# Patient Record
Sex: Male | Born: 1980 | Race: White | Hispanic: No | Marital: Single | Smoking: Never smoker
Health system: Southern US, Community
[De-identification: ages and names within clinical notes are randomized; demographics above are authoritative.]

## PROBLEM LIST (undated history)

## (undated) DIAGNOSIS — J45909 Unspecified asthma, uncomplicated: Secondary | ICD-10-CM

## (undated) HISTORY — PX: MYRINGOTOMY: SUR874

## (undated) HISTORY — PX: ADENOIDECTOMY: SUR15

## (undated) HISTORY — DX: Unspecified asthma, uncomplicated: J45.909

---

## 2012-09-10 ENCOUNTER — Ambulatory Visit (INDEPENDENT_AMBULATORY_CARE_PROVIDER_SITE_OTHER): Payer: Federal, State, Local not specified - PPO | Admitting: Sports Medicine

## 2012-09-10 ENCOUNTER — Emergency Department (INDEPENDENT_AMBULATORY_CARE_PROVIDER_SITE_OTHER)
Admission: EM | Admit: 2012-09-10 | Discharge: 2012-09-10 | Disposition: A | Discharge status: Home / Self Care | Payer: Federal, State, Local not specified - PPO | Source: Home / Self Care | Attending: Family Medicine | Admitting: Family Medicine

## 2012-09-10 ENCOUNTER — Encounter: Payer: Self-pay | Admitting: *Deleted

## 2012-09-10 ENCOUNTER — Emergency Department (INDEPENDENT_AMBULATORY_CARE_PROVIDER_SITE_OTHER): Payer: Federal, State, Local not specified - PPO

## 2012-09-10 DIAGNOSIS — M65849 Other synovitis and tenosynovitis, unspecified hand: Secondary | ICD-10-CM

## 2012-09-10 DIAGNOSIS — IMO0002 Reserved for concepts with insufficient information to code with codable children: Secondary | ICD-10-CM

## 2012-09-10 DIAGNOSIS — M65839 Other synovitis and tenosynovitis, unspecified forearm: Secondary | ICD-10-CM

## 2012-09-10 DIAGNOSIS — S6990XA Unspecified injury of unspecified wrist, hand and finger(s), initial encounter: Secondary | ICD-10-CM

## 2012-09-10 DIAGNOSIS — R209 Unspecified disturbances of skin sensation: Secondary | ICD-10-CM

## 2012-09-10 DIAGNOSIS — S6991XA Unspecified injury of right wrist, hand and finger(s), initial encounter: Secondary | ICD-10-CM

## 2012-09-10 DIAGNOSIS — M79609 Pain in unspecified limb: Secondary | ICD-10-CM

## 2012-09-10 DIAGNOSIS — M778 Other enthesopathies, not elsewhere classified: Secondary | ICD-10-CM

## 2012-09-10 NOTE — Assessment & Plan Note (Signed)
The patient would like to minimize the amount of prescription medication or procedures that are performed. I recommended icing, as well as ibuprofen 800 mg 3 times a day. He can come back to see me if no better in one to 2 weeks.

## 2012-09-10 NOTE — Progress Notes (Signed)
   Subjective:    I'm seeing this patient as a consultation for:  Dr. Cathren Harsh  CC: Right hand pain  HPI: This is a very pleasant 32 year old male who comes in after actually banging the dorsum of his right hand on a wrench. He had immediate pain, swelling, and inability to make a complete fist. It is localized, doesn't radiate, moderate. His main reason for coming in today was to ensure that there was a fracture.  Past medical history, Surgical history, Family history not pertinant except as noted below, Social history, Allergies, and medications have been entered into the medical record, reviewed, and no changes needed.   Review of Systems: No headache, visual changes, nausea, vomiting, diarrhea, constipation, dizziness, abdominal pain, skin rash, fevers, chills, night sweats, weight loss, swollen lymph nodes, body aches, joint swelling, muscle aches, chest pain, shortness of breath, mood changes, visual or auditory hallucinations.   Objective:   General: Well Developed, well nourished, and in no acute distress.  Neuro/Psych: Alert and oriented x3, extra-ocular muscles intact, able to move all 4 extremities, sensation grossly intact. Skin: Warm and dry, no rashes noted.  Respiratory: Not using accessory muscles, speaking in full sentences, trachea midline.  Cardiovascular: Pulses palpable, no extremity edema. Abdomen: Does not appear distended. Right Hand: swollen over the dorsum, he has tenderness to palpation over the extensor digitorum communis of the third digit. He has pain with resisted extension of the third digit, can make a full fist. He is neurovascularly intact distally.  X-rays show no sign of fracture, dislocation, or degenerative change.   Impression and Recommendations:   This case required medical decision making of moderate complexity.

## 2012-09-10 NOTE — ED Provider Notes (Signed)
History     CSN: [id number]  Arrival date & time 09/10/12  1537   First MD Initiated Contact with Patient 09/10/12 1605      Chief Complaint  Patient presents with  . Finger Injury      HPI Comments: While loading hay two months ago, patient accidentally bumped the dorsum of his right 3rd MCP joint and has had persistent intermittent pain there with occasional movements.  Yesterday a wrench slipped and he forcefully bumped the dorsum of his right hand and has had persistent swelling and tenderness there also.  Patient is a 32 y.o. male presenting with hand pain. The history is provided by the patient.  Hand Pain This is a new problem. Episode onset: 2 months ago. The problem occurs constantly. The problem has not changed since onset.Associated symptoms comments: none. Exacerbated by: grasping. Nothing relieves the symptoms. He has tried nothing for the symptoms.    History reviewed. No pertinent past medical history.  Past Surgical History  Procedure Laterality Date  . Myringotomy    . Adenoidectomy      Family History  Problem Relation Age of Onset  . Hyperlipidemia Father   . Hypertension Father     History  Substance Use Topics  . Smoking status: Never Smoker   . Smokeless tobacco: Not on file  . Alcohol Use: No      Review of Systems  All other systems reviewed and are negative.    Allergies  Review of patient's allergies indicates no known allergies.  Home Medications  No current outpatient prescriptions on file.  BP 147/80  Pulse 66  Temp(Src) 98.5 F (36.9 C) (Oral)  Resp 18  Wt 209 lb (94.802 kg)  SpO2 99%  Physical Exam  Nursing note and vitals reviewed. Constitutional: He is oriented to person, place, and time. He appears well-developed and well-nourished. No distress.  HENT:  Head: Normocephalic.  Eyes: Conjunctivae are normal. Pupils are equal, round, and reactive to light.  Musculoskeletal: Normal range of motion. He exhibits tenderness.        Right hand: He exhibits tenderness, bony tenderness and swelling. He exhibits normal two-point discrimination, normal capillary refill, no deformity and no laceration. Normal sensation noted. Normal strength noted.       Hands: Right hand reveals tenderness and mild swelling dorsally over third metacarpal and extensor indicis tendon.  Patient has difficulty with full flexion of the 3rd MCP joint.  Distal neurovascular function is intact.   Neurological: He is alert and oriented to person, place, and time.  Skin: Skin is warm and dry. No rash noted.    ED Course  Procedures  none   Dg Hand Complete Right  09/10/2012  *RADIOLOGY REPORT*  Clinical Data: Pain and tenderness over the third metacarpal phalangeal joint secondary to injury.  RIGHT HAND - COMPLETE 3+ VIEW  Comparison: None.  Findings: There is no fracture, dislocation, joint space narrowing, subluxation, arthritis, or other abnormality.  IMPRESSION: Normal exam.   Original Report Authenticated By: Francene Boyers, M.D.      1. Hand injury, right, initial encounter       MDM   Consultation obtained with Dr. Rodney Langton  Apply ice pack for 30 to 45 minutes every 1 to 4 hours.  Continue until swelling decreases.  Take Ibuprofen 200mg , 3 or 4 tabs every 8 hours with food.  Followup with Dr. Rodney Langton if not improving        Lattie Haw, MD 09/10/12 1714

## 2012-09-10 NOTE — ED Notes (Signed)
Pt c/o RT 3rd finger injury x after injuring it, worse x yesterday after hitting on a piece of metal while using a wrench. No OTC meds.

## 2012-12-19 ENCOUNTER — Encounter: Payer: Self-pay | Admitting: *Deleted

## 2012-12-19 ENCOUNTER — Emergency Department (INDEPENDENT_AMBULATORY_CARE_PROVIDER_SITE_OTHER)
Admission: EM | Admit: 2012-12-19 | Discharge: 2012-12-19 | Disposition: A | Discharge status: Home / Self Care | Payer: Federal, State, Local not specified - PPO | Source: Home / Self Care | Attending: Family Medicine | Admitting: Family Medicine

## 2012-12-19 DIAGNOSIS — J302 Other seasonal allergic rhinitis: Secondary | ICD-10-CM

## 2012-12-19 DIAGNOSIS — J309 Allergic rhinitis, unspecified: Secondary | ICD-10-CM

## 2012-12-19 MED ORDER — FLUTICASONE PROPIONATE 50 MCG/ACT NA SUSP: NASAL | Status: DC

## 2012-12-19 MED ORDER — PREDNISONE 20 MG PO TABS: 20.0000 mg | ORAL_TABLET | Freq: Two times a day (BID) | ORAL | Status: DC

## 2012-12-19 NOTE — ED Provider Notes (Signed)
CSN: [id number]     Arrival date & time 12/19/12  0847 History     First MD Initiated Contact with Patient 12/19/12 [phone]     Chief Complaint  Patient presents with  . URI      HPI Comments: Patient complains of 5 day history of sinus congestion, now with rhinorrhea.  He has a rare cough.  No sore throat, fatigue, myalgias, or fevers, chills, and sweats.  He feels well. He states that this condition begins every spring and fall, and clears by late fall or early winter.  He is presently taking Zyrtec with mild improvement.  The history is provided by the patient.    History reviewed. No pertinent past medical history. Past Surgical History  Procedure Laterality Date  . Myringotomy    . Adenoidectomy     Family History  Problem Relation Age of Onset  . Hyperlipidemia Father   . Hypertension Father    History  Substance Use Topics  . Smoking status: Never Smoker   . Smokeless tobacco: Current User    Types: Snuff  . Alcohol Use: No    Review of Systems No sore throat + rare cough + hoarseness No pleuritic pain + wheezing occasionally + nasal congestion + post-nasal drainage No sinus pain/pressure No itchy/red eyes No earache No hemoptysis No SOB No fever/chills No nausea No vomiting No abdominal pain No diarrhea No urinary symptoms No skin rashes No fatigue No myalgias No headache Used OTC meds without relief  Allergies  Review of patient's allergies indicates no known allergies.  Home Medications   Current Outpatient Rx  Name  Route  Sig  Dispense  Refill  . fluticasone (FLONASE) 50 MCG/ACT nasal spray      Place 2 sprays in each nostril once daily for allergy symptoms   16 g   2   . predniSONE (DELTASONE) 20 MG tablet   Oral   Take 1 tablet (20 mg total) by mouth 2 (two) times daily.   10 tablet   0    BP 133/82  Pulse 87  Temp(Src) 98.7 F (37.1 C) (Oral)  Resp 14  Ht 6' (1.829 m)  Wt 208 lb (94.348 kg)  BMI 28.2 kg/m2  SpO2  99% Physical Exam Nursing notes and Vital Signs reviewed. Appearance:  Patient appears healthy, stated age, and in no acute distress Eyes:  Pupils are equal, round, and reactive to light and accomodation.  Extraocular movement is intact.  Conjunctivae are not inflamed  Ears:  Canals normal.  Tympanic membranes normal.  Nose:  Mildly congested turbinates.  No sinus tenderness.  Pharynx:  Normal Neck:  Supple.  No adenopathy Lungs:  Clear to auscultation.  Breath sounds are equal.  Heart:  Regular rate and rhythm without murmurs, rubs, or gallops.  Abdomen:  Nontender without masses or hepatosplenomegaly.  Bowel sounds are present.  No CVA or flank tenderness.  Extremities:  No edema.  No calf tenderness Skin:  No rash present.   ED Course   Procedures  none    1. Seasonal allergic rhinitis; no evidence URI at this time     MDM  Begin fluticasone nasal spray.  Prednisone burst May use Afrin nasal spray (or generic oxymetazoline) twice daily for about 5 days.  Also recommend using saline nasal spray several times daily and saline nasal irrigation (AYR is a common brand).  Use fluticasone spray each morning after using Afrin May Continue Zyrtec, Allegra, etc..  If cold-like symptoms develop, begin  Mucinex D May take Delsym Cough Suppressant at bedtime for nighttime cough.   Lattie Haw, MD 12/19/12 [phone]

## 2012-12-19 NOTE — ED Notes (Signed)
Jaqua c/o congestion, sneexing and non-productive cough without fever x 3 days.

## 2012-12-21 ENCOUNTER — Telehealth: Payer: Self-pay | Admitting: Emergency Medicine

## 2013-07-02 ENCOUNTER — Emergency Department
Admission: EM | Admit: 2013-07-02 | Discharge: 2013-07-02 | Disposition: A | Discharge status: Home / Self Care | Payer: Federal, State, Local not specified - PPO | Source: Home / Self Care | Attending: Family Medicine | Admitting: Family Medicine

## 2013-07-02 ENCOUNTER — Emergency Department (INDEPENDENT_AMBULATORY_CARE_PROVIDER_SITE_OTHER): Payer: Federal, State, Local not specified - PPO

## 2013-07-02 ENCOUNTER — Encounter: Payer: Self-pay | Admitting: Emergency Medicine

## 2013-07-02 DIAGNOSIS — J209 Acute bronchitis, unspecified: Secondary | ICD-10-CM

## 2013-07-02 DIAGNOSIS — R05 Cough: Secondary | ICD-10-CM

## 2013-07-02 DIAGNOSIS — H6692 Otitis media, unspecified, left ear: Secondary | ICD-10-CM

## 2013-07-02 DIAGNOSIS — R059 Cough, unspecified: Secondary | ICD-10-CM

## 2013-07-02 DIAGNOSIS — H6690 Otitis media, unspecified, unspecified ear: Secondary | ICD-10-CM

## 2013-07-02 LAB — POCT CBC W AUTO DIFF (K'VILLE URGENT CARE)

## 2013-07-02 MED ORDER — GUAIFENESIN-CODEINE 100-10 MG/5ML PO SOLN: ORAL | Status: DC

## 2013-07-02 MED ORDER — CLARITHROMYCIN 500 MG PO TABS: 500.0000 mg | ORAL_TABLET | Freq: Two times a day (BID) | ORAL | Status: DC

## 2013-07-02 MED ORDER — METHYLPREDNISOLONE SODIUM SUCC 125 MG IJ SOLR
125.0000 mg | Freq: Once | INTRAMUSCULAR | Status: AC
  Administered 2013-07-02: 125 mg via INTRAMUSCULAR

## 2013-07-02 MED ORDER — PREDNISONE 20 MG PO TABS: 20.0000 mg | ORAL_TABLET | Freq: Two times a day (BID) | ORAL | Status: DC

## 2013-07-02 NOTE — Discharge Instructions (Signed)
Take plain Mucinex (1200 mg guaifenesin) twice daily for cough and congestion.  May add Sudafed for sinus congestion.   Increase fluid intake, rest. °May use Afrin nasal spray (or generic oxymetazoline) twice daily for about 5 days.  Also recommend using saline nasal spray several times daily and saline nasal irrigation (AYR is a common brand) °Stop all antihistamines for now, and other non-prescription cough/cold preparations. °Follow-up with family doctor if not improving 7 to 10 days.  °

## 2013-07-02 NOTE — ED Provider Notes (Signed)
CSN: [id number]     Arrival date & time 07/02/13  1137 History   First MD Initiated Contact with Patient 07/02/13 1218     Chief Complaint  Patient presents with  . Cough        HPI Comments: Patient complains of a persistent cough for about 3 weeks.  Two weeks ago he was treated with cough syrup, Tessalon, and prednisone for 3 days but did not improve.  His cough is now worse and non-productive, especially at night; he often coughs until he gags.  He becomes winded with exertion, and also has shortness of breath when exposed to cold air.  Over the past three days he has had pressure in his ears.  Last night he had chills/sweats. He reports that his Tdap is current. Family history of asthma in his father.  The history is provided by the patient.    History reviewed. No pertinent past medical history. Past Surgical History  Procedure Laterality Date  . Myringotomy    . Adenoidectomy     Family History  Problem Relation Age of Onset  . Hyperlipidemia Father   . Hypertension Father    History  Substance Use Topics  . Smoking status: Never Smoker   . Smokeless tobacco: Current User    Types: Snuff  . Alcohol Use: No    Review of Systems + sore throat + cough No pleuritic pain + wheezing + nasal congestion ? post-nasal drainage No sinus pain/pressure No itchy/red eyes ? earache No hemoptysis + SOB with exertion No fever, + chills No nausea No vomiting No abdominal pain No diarrhea No urinary symptoms No skin rash + fatigue No myalgias No headache Used OTC meds without relief    Allergies  Review of patient's allergies indicates not on file.  Home Medications   Current Outpatient Rx  Name  Route  Sig  Dispense  Refill  . clarithromycin (BIAXIN) 500 MG tablet   Oral   Take 1 tablet (500 mg total) by mouth 2 (two) times daily.   14 tablet   0   . fluticasone (FLONASE) 50 MCG/ACT nasal spray      Place 2 sprays in each nostril once daily for allergy  symptoms   16 g   2   . guaiFENesin-codeine 100-10 MG/5ML syrup      Take 10mL by mouth at bedtime as needed for cough   120 mL   0   . predniSONE (DELTASONE) 20 MG tablet   Oral   Take 1 tablet (20 mg total) by mouth 2 (two) times daily.   10 tablet   0    BP 140/92  Pulse 90  Temp(Src) 98.4 F (36.9 C) (Oral)  Ht 6' (1.829 m)  Wt 216 lb (97.977 kg)  BMI 29.29 kg/m2  SpO2 99% Physical Exam Nursing notes and Vital Signs reviewed. Appearance:  Patient appears healthy, stated age, and in no acute distress Eyes:  Pupils are equal, round, and reactive to light and accomodation.  Extraocular movement is intact.  Conjunctivae are not inflamed  Ears:  Canals normal.   Right tympanic membrane is normal; left tympanic membrane is erythematous  Nose:  Congested turbinates, worse on left.  No sinus tenderness.    Pharynx:  Normal Neck:  Supple.  Non-tender but prominent posterior nodes are palpated bilaterally  Lungs:  Clear to auscultation.  Breath sounds are equal.  Heart:  Regular rate and rhythm without murmurs, rubs, or gallops.  Abdomen:  Nontender without  masses or hepatosplenomegaly.  Bowel sounds are present.  No CVA or flank tenderness.  Extremities:  No edema.  No calf tenderness Skin:  No rash present.   ED Course  Procedures  none    Labs Reviewed  POCT CBC W AUTO DIFF ([facility]):  WBC 5.5; LY 29.4; MO 9.3; GR 61.3; Hgb 14.6; Platelets 250    Imaging Review Dg Chest 2 View  07/02/2013   CLINICAL DATA:  Dry cough.  EXAM: CHEST  2 VIEW  COMPARISON:  None.  FINDINGS: The cardiac silhouette, mediastinal and hilar contours are within normal limits. The lungs demonstrate mild bronchitic changes suggesting bronchitis. No focal infiltrates, edema or effusions. The bony thorax is intact. Mild right convex thoracic scoliosis.  IMPRESSION: Bronchitic changes suggesting bronchitis.  No focal infiltrates.   Electronically Signed   By: Loralie ChampagneMark  Gallerani M.D.   On:  07/02/2013 12:49      MDM   Final diagnoses:  Acute bronchitis:  Note normal white count; ?atypical agent.  Suspect onset of bronchospasm (note family history of asthma in father)  Left otitis media    Solumedrol 125mg  IM.  Begin prednisone taper tomorrow. Robitussin AC at bedtme.  Begin Biaxin Take plain Mucinex (1200 mg guaifenesin) twice daily for cough and congestion.  May add Sudafed for sinus congestion.   Increase fluid intake, rest. May use Afrin nasal spray (or generic oxymetazoline) twice daily for about 5 days.  Also recommend using saline nasal spray several times daily and saline nasal irrigation (AYR is a common brand) Stop all antihistamines for now, and other non-prescription cough/cold preparations. Follow-up with family doctor if not improving 7 to 10 days.  Follow-up with ENT if left ear not improved one week.    Lattie HawStephen A Beese, MD 07/04/13 [phone]

## 2013-07-02 NOTE — ED Notes (Signed)
Cough x 3 weeks. Treated on 2/12 with cough syrup, tessalon, 3 days of prednisone, not better

## 2013-07-06 ENCOUNTER — Telehealth: Payer: Self-pay

## 2013-07-06 NOTE — ED Notes (Signed)
I called and spoke with patient and he is doing better. I advised to call back if anything changes or if he has questions or concerns.  

## 2014-07-13 ENCOUNTER — Encounter: Payer: Self-pay | Admitting: Emergency Medicine

## 2014-07-13 ENCOUNTER — Emergency Department
Admission: EM | Admit: 2014-07-13 | Discharge: 2014-07-13 | Disposition: A | Discharge status: Home / Self Care | Payer: Federal, State, Local not specified - PPO | Source: Home / Self Care | Attending: Family Medicine | Admitting: Family Medicine

## 2014-07-13 DIAGNOSIS — B9789 Other viral agents as the cause of diseases classified elsewhere: Principal | ICD-10-CM

## 2014-07-13 DIAGNOSIS — J069 Acute upper respiratory infection, unspecified: Secondary | ICD-10-CM

## 2014-07-13 DIAGNOSIS — J9801 Acute bronchospasm: Secondary | ICD-10-CM

## 2014-07-13 MED ORDER — BENZONATATE 200 MG PO CAPS: 200.0000 mg | ORAL_CAPSULE | Freq: Every day | ORAL | Status: DC

## 2014-07-13 MED ORDER — AMOXICILLIN 875 MG PO TABS: 875.0000 mg | ORAL_TABLET | Freq: Two times a day (BID) | ORAL | Status: DC

## 2014-07-13 MED ORDER — PREDNISONE 20 MG PO TABS: 20.0000 mg | ORAL_TABLET | Freq: Two times a day (BID) | ORAL | Status: DC

## 2014-07-13 NOTE — Discharge Instructions (Signed)
Take plain guaifenesin (1200mg  extended release tabs such as Mucinex) twice daily, with plenty of water, for cough and congestion.  May add Pseudoephedrine for sinus congestion.  Get adequate rest.   May use Afrin nasal spray (or generic oxymetazoline) twice daily for about 5 days.  Also recommend using saline nasal spray several times daily and saline nasal irrigation (AYR is a common brand).    Stop all antihistamines for now, and other non-prescription cough/cold preparations.   Follow-up with family doctor if not improving about10 days.

## 2014-07-13 NOTE — ED Notes (Signed)
Patient states he began having nasal congestion and cough 3 days ago; no documented fever. He has not taken any OTCs today.

## 2014-07-13 NOTE — ED Provider Notes (Signed)
CSN: [id number]     Arrival date & time 07/13/14  1027 History   First MD Initiated Contact with Patient 07/13/14 1048     Chief Complaint  Patient presents with  . Nasal Congestion  . Cough      HPI Comments: Three days ago patient developed fatigue and sinus congestion, followed by cough, wheezing, and shortness of breath with activity.  No sore throat.  He started taking left over amoxicillin and symptoms improved somewhat.  The history is provided by the patient.    History reviewed. No pertinent past medical history. Past Surgical History  Procedure Laterality Date  . Myringotomy    . Adenoidectomy     Family History  Problem Relation Age of Onset  . Hyperlipidemia Father   . Hypertension Father    History  Substance Use Topics  . Smoking status: Never Smoker   . Smokeless tobacco: Current User    Types: Snuff  . Alcohol Use: No    Review of Systems No sore throat + cough No pleuritic pain + wheezing + nasal congestion + post-nasal drainage + sinus pain/pressure No itchy/red eyes No earache No hemoptysis + SOB with activity No fever/chills No nausea No vomiting No abdominal pain No diarrhea No urinary symptoms No skin rash + fatigue No myalgias No headache Used OTC meds without relief  Allergies  Review of patient's allergies indicates no known allergies.  Home Medications   Prior to Admission medications   Medication Sig Start Date End Date Taking? Authorizing Provider  amoxicillin (AMOXIL) 875 MG tablet Take 1 tablet (875 mg total) by mouth 2 (two) times daily. 07/13/14   Lattie Haw, MD  benzonatate (TESSALON) 200 MG capsule Take 1 capsule (200 mg total) by mouth at bedtime. Take as needed for cough 07/13/14   Lattie Haw, MD  fluticasone [facility]) 50 MCG/ACT nasal spray Place 2 sprays in each nostril once daily for allergy symptoms 12/19/12   Lattie Haw, MD  predniSONE (DELTASONE) 20 MG tablet Take 1 tablet (20 mg total) by mouth 2  (two) times daily. 07/13/14   Lattie Haw, MD   BP 120/80 mmHg  Pulse 80  Temp(Src) 98.4 F (36.9 C) (Oral)  Resp 16  Ht 6' (1.829 m)  Wt 195 lb (88.451 kg)  BMI 26.44 kg/m2  SpO2 100% Physical Exam Nursing notes and Vital Signs reviewed. Appearance:  Patient appears stated age, and in no acute distress Eyes:  Pupils are equal, round, and reactive to light and accomodation.  Extraocular movement is intact.  Conjunctivae are not inflamed  Ears:  Canals normal.  Tympanic membranes normal.  Nose:  Mildly congested turbinates.  No sinus tenderness.  Pharynx:  Normal Neck:  Supple.  Tender enlarged posterior nodes are palpated bilaterally  Lungs:  Clear to auscultation.  Breath sounds are equal.  Heart:  Regular rate and rhythm without murmurs, rubs, or gallops.  Abdomen:  Nontender without masses or hepatosplenomegaly.  Bowel sounds are present.  No CVA or flank tenderness.  Extremities:  No edema.  No calf tenderness Skin:  No rash present.   ED Course  Procedures  none     MDM   1. Viral URI with cough   2. Bronchospasm    Begin prednisone burst.  Prescription written for Benzonatate (Tessalon) to take at bedtime for night-time cough.  Amoxicillin  BID Take plain guaifenesin (  extended release tabs such as Mucinex) twice daily, with plenty of water, for cough and congestion.  May add Pseudoephedrine for sinus congestion.  Get adequate rest.   May use Afrin nasal spray (or generic oxymetazoline) twice daily for about 5 days.  Also recommend using saline nasal spray several times daily and saline nasal irrigation (AYR is a common brand).    Stop all antihistamines for now, and other non-prescription cough/cold preparations.   Follow-up with family doctor if not improving about10 days.     Lattie HawStephen A Tomiko Schoon, MD 07/14/14 [phone]

## 2014-08-06 ENCOUNTER — Encounter: Payer: Self-pay | Admitting: Emergency Medicine

## 2014-08-06 ENCOUNTER — Emergency Department
Admission: EM | Admit: 2014-08-06 | Discharge: 2014-08-06 | Disposition: A | Discharge status: Home / Self Care | Payer: Federal, State, Local not specified - PPO | Source: Home / Self Care | Attending: Emergency Medicine | Admitting: Emergency Medicine

## 2014-08-06 DIAGNOSIS — J4520 Mild intermittent asthma, uncomplicated: Secondary | ICD-10-CM

## 2014-08-06 DIAGNOSIS — J301 Allergic rhinitis due to pollen: Secondary | ICD-10-CM | POA: Diagnosis not present

## 2014-08-06 MED ORDER — PREDNISONE 50 MG PO TABS: 50.0000 mg | ORAL_TABLET | Freq: Every day | ORAL | Status: DC

## 2014-08-06 MED ORDER — CETIRIZINE HCL 10 MG PO TABS: 10.0000 mg | ORAL_TABLET | Freq: Every day | ORAL | Status: DC

## 2014-08-06 MED ORDER — FLUTICASONE PROPIONATE 50 MCG/ACT NA SUSP: NASAL | Status: DC

## 2014-08-06 NOTE — ED Provider Notes (Addendum)
CSN: [id number]     Arrival date & time 08/06/14  1452 History   First MD Initiated Contact with Patient 08/06/14 1500     Chief Complaint  Patient presents with  . Allergies   (Consider location/radiation/quality/duration/timing/severity/associated sxs/prior Treatment) HPI Runny nose, sneezing, itchy nose, watery eyes x 2 days. Was moderate intensity, now severe. Today, progressed to slight wheezing. Occasional nonproductive cough. Denies shortness of breath. No exertional chest pain. Denies sore throat but throat feels scratchy from postnasal drainage. Has clear rhinorrhea from nose. No discolored matting or drainage from eyes. He feels this is from seasonal allergies. He tried some OTC decongestant which didn't help that much. Was seen at urgent care 3-4 weeks ago for URI with allergies and treated with prednisone and an antibiotic and that helped and he felt well after 5 days, and was asymptomatic until 2 days ago. Note that pollen counts are extremely high the past couple of days. He works outside. He denies exposure to any other toxic substance or fumes or materials. He never smoked cigarettes, but uses snuff and I advised him not to use snuff because of risks. Denies history of asthma, but there is a positive family history of asthma and one of his parents.  Remainder of Review of Systems negative for acute change except as noted in the HPI.   History reviewed. No pertinent past medical history. Past Surgical History  Procedure Laterality Date  . Myringotomy    . Adenoidectomy     Family History  Problem Relation Age of Onset  . Hyperlipidemia Father   . Hypertension Father    History  Substance Use Topics  . Smoking status: Never Smoker   . Smokeless tobacco: Current User    Types: Snuff  . Alcohol Use: No    Review of Systems  Allergies  Review of patient's allergies indicates not on file.  Home Medications   Prior to Admission medications   Medication Sig  Start Date End Date Taking? Authorizing Provider  cetirizine (ZYRTEC) 10 MG tablet Take 1 tablet (10 mg total) by mouth daily. For allergies 08/06/14   Lajean Manesavid Massey, MD  fluticasone [facility](FLONASE) 50 MCG/ACT nasal spray 1 or 2 sprays each nostril twice a day 08/06/14   Lajean Manesavid Massey, MD  predniSONE (DELTASONE) 50 MG tablet Take 1 tablet (50 mg total) by mouth daily. With food for 7 days. 08/06/14   Lajean Manesavid Massey, MD   BP 133/85 mmHg  Pulse 86  Temp(Src) 98.2 F (36.8 C) (Oral)  Ht 6' (1.829 m)  Wt 209 lb (94.802 kg)  BMI 28.34 kg/m2  SpO2 100% Physical Exam  Constitutional: He is oriented to person, place, and time. He appears well-developed and well-nourished. No distress.  HENT:  Head: Normocephalic and atraumatic.  Right Ear: Tympanic membrane normal.  Left Ear: Tympanic membrane normal.  Mouth/Throat: No oropharyngeal exudate.  Nose: Pale boggy turbinates, serous drainage. Pharynx negative except for mild lymphoid hyperplasia and serous postnasal drainage. TMs normal  Eyes: Right eye exhibits no discharge. Left eye exhibits no discharge. No scleral icterus.  Neck: Neck supple. No JVD present. No tracheal deviation present.  Cardiovascular: Normal rate, regular rhythm and normal heart sounds.   Pulmonary/Chest: Effort normal. No respiratory distress. He has no decreased breath sounds. He has no wheezes. He has no rhonchi. He has no rales.  Lungs are clear to auscultation, except only with forced expiration, there is scant late expiratory wheeze, but good air movement bilaterally.  Lymphadenopathy:    He  has no cervical adenopathy.  Neurological: He is alert and oriented to person, place, and time.  Skin: Skin is warm and dry.  Nursing note and vitals reviewed.   ED Course  Procedures (including critical care time) Labs Review Labs Reviewed - No data to display  Imaging Review No results found.   MDM   1. Allergic rhinitis due to pollen   2. Allergic bronchitis, mild  intermittent, uncomplicated    no evidence of infection based on history and physical Treatment options discussed, as well as risks, benefits, alternatives. He declined steroid shot Patient voiced understanding and agreement with the following plans: Discharge Medication List as of 08/06/2014  3:35 PM     cetirizine (ZYRTEC) 10 MG tablet Take 1 tablet (10 mg total) by mouth daily. For allergies 15 tablet     fluticasone (FLONASE) 50 MCG/ACT nasal spray 1 or 2 sprays each nostril twice a day 16 g    predniSONE (DELTASONE) 50 MG tablet Take 1 tablet (50 mg total) by mouth daily. With food for 7 days.     Also advised him to establish with PCP and follow up with PCP within one week if not improving or sooner if worse. I also suggested he see an allergist, but he declined that option at this time. He has numerous other questions are answered as best I could. He declined bronchodilator Precautions discussed. Red flags discussed. Questions invited and answered. Patient voiced understanding and agreement.  Also, at his request, note was given to excuse from work today which is Friday and tomorrow 08/07/2014   Lajean Manes, MD 08/06/14 1549  Lajean Manes, MD 08/06/14 [phone]

## 2014-08-06 NOTE — ED Notes (Signed)
Runny nose, sneezing, itchy nose, watery eyes 2 days

## 2014-12-14 ENCOUNTER — Encounter: Payer: Self-pay | Admitting: Emergency Medicine

## 2014-12-14 ENCOUNTER — Emergency Department
Admission: EM | Admit: 2014-12-14 | Discharge: 2014-12-14 | Disposition: A | Discharge status: Home / Self Care | Payer: Federal, State, Local not specified - PPO | Source: Home / Self Care | Attending: Family Medicine | Admitting: Family Medicine

## 2014-12-14 DIAGNOSIS — R5383 Other fatigue: Secondary | ICD-10-CM | POA: Diagnosis not present

## 2014-12-14 DIAGNOSIS — R52 Pain, unspecified: Secondary | ICD-10-CM

## 2014-12-14 DIAGNOSIS — W57XXXA Bitten or stung by nonvenomous insect and other nonvenomous arthropods, initial encounter: Secondary | ICD-10-CM

## 2014-12-14 LAB — POCT CBC W AUTO DIFF (K'VILLE URGENT CARE)

## 2014-12-14 MED ORDER — DOXYCYCLINE HYCLATE 100 MG PO CAPS: 100.0000 mg | ORAL_CAPSULE | Freq: Two times a day (BID) | ORAL | Status: DC

## 2014-12-14 NOTE — ED Notes (Signed)
Sat night headache, Sun morning woke up with unsettled stomach, extreme fatigue, chills fever. 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

## 2014-12-14 NOTE — ED Provider Notes (Signed)
CSN: [id number]     Arrival date & time 12/14/14  1233 History   First MD Initiated Contact with Patient 12/14/14 1249     Chief Complaint  Patient presents with  . Fever   (Consider location/radiation/quality/duration/timing/severity/associated sxs/prior Treatment) HPI The patient is a 34 year old male presenting to urgent care with complaints of generalized fatigue and body aches that started 2 days ago.  Patient states symptoms started with a generalized headache Saturday night and worsened Sunday morning with diffuse weakness, fatigue, and nausea.   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  Patient does note he has pulled several ticks off this season.  Although he does not recall ticks being there for more than 3 days, patient is not 100% certain.  Denies known rashes.  Last tick was removed about 1 week ago.  He also reports subjective fever with hot and cold chills.  States he did take some tylenol and ibuprofen yesterday that provided moderate relief, however, pt still concerned for Lyme's disease or RMSF.  Denies neck pain or stiffness.  Denies chest pain or difficulty breathing.  No sick contacts.  No recent travel.  History reviewed. No pertinent past medical history. Past Surgical History  Procedure Laterality Date  . Myringotomy    . Adenoidectomy     Family History  Problem Relation Age of Onset  . Hyperlipidemia Father   . Hypertension Father    History  Substance Use Topics  . Smoking status: Never Smoker   . Smokeless tobacco: Current User    Types: Snuff  . Alcohol Use: No    Review of Systems  Constitutional: Positive for chills, appetite change and fatigue. Negative for fever.  HENT: Negative for congestion, ear pain, sore throat, trouble swallowing and voice change.   Respiratory: Negative for cough and shortness of breath.   Cardiovascular: Negative for chest pain and  palpitations.  Gastrointestinal: Positive for nausea. Negative for vomiting, abdominal pain and diarrhea.  Musculoskeletal: Positive for myalgias and back pain. Negative for arthralgias.  Skin: Negative for rash.  Neurological: Positive for headaches. Negative for dizziness, syncope and light-headedness.  All other systems reviewed and are negative.   Allergies  Review of patient's allergies indicates no known allergies.  Home Medications   Prior to Admission medications   Medication Sig Start Date End Date Taking? Authorizing Provider  cetirizine (ZYRTEC) 10 MG tablet Take 1 tablet (10 mg total) by mouth daily. For allergies 08/06/14   Lajean Manes, MD  doxycycline (VIBRAMYCIN) 100 MG capsule Take 1 capsule (100 mg total) by mouth 2 (two) times daily. One po bid x 14 days 12/14/14   Junius Finner, PA-C  fluticasone [facility]) 50 MCG/ACT nasal spray 1 or 2 sprays each nostril twice a day 08/06/14   Lajean Manes, MD  predniSONE (DELTASONE) 50 MG tablet Take 1 tablet (50 mg total) by mouth daily. With food for 7 days. 08/06/14   Lajean Manes, MD   BP 123/79 mmHg  Pulse 73  Temp(Src) 98.3 F (36.8 C) (Oral)  Ht 6' (1.829 m)  Wt 209 lb (94.802 kg)  BMI 28.34 kg/m2  SpO2 99% Physical Exam  Constitutional: He appears well-developed and well-nourished.  HENT:  Head: Normocephalic and atraumatic.  Right Ear: Hearing, tympanic membrane, external ear and ear canal normal.  Left Ear: Hearing, tympanic membrane, external ear and ear canal normal.  Nose: Nose normal.  Mouth/Throat: Uvula is midline,  oropharynx is clear and moist and mucous membranes are normal.  Eyes: Conjunctivae are normal. No scleral icterus.  Neck: Normal range of motion. Neck supple.  Cardiovascular: Normal rate, regular rhythm and normal heart sounds.   Pulmonary/Chest: Effort normal and breath sounds normal. No respiratory distress. He has no wheezes. He has no rales. He exhibits no tenderness.  Abdominal: Soft. Bowel  sounds are normal. He exhibits no distension and no mass. There is no tenderness. There is no rebound and no guarding.  Musculoskeletal: Normal range of motion.  Neurological: He is alert.  Skin: Skin is warm and dry. No rash noted. No erythema.  Nursing note and vitals reviewed.   ED Course  Procedures (including critical care time) Labs Review Labs Reviewed  CBC  [place] SPOTTED FVR ABS PNL(IGG+IGM)  B. BURGDORFI ANTIBODIES    Imaging Review No results found.   MDM   1. Other fatigue   2. Tick bites   3. Body aches    Patient complaining of flulike symptoms with body aches and fatigue.  Patient appears well, nontoxic.  Patient is afebrile.  No rashes seen on exam.  Patient is concerned for [facility] spotted fever or Lyme's disease due to removing several ticks over last few months.  No sick contacts or recent travel. Blood work for [employer] disease, and [employer] spotted fever, sent to lab today. Due to patient's concerns and risk factors for tick borne illnesses, will start patient on doxycycline today for 2 weeks. Advised to f/u with PCP in 1 week if not improving. Encouraged fluids, rest, may take acetaminophen and ibuprofen as needed for fever and pain. Work note for tomorrow provided. Patient verbalized understanding and agreement with treatment plan.    Junius Finner, PA-C 12/14/14 1430

## 2014-12-15 ENCOUNTER — Telehealth: Payer: Self-pay | Admitting: Emergency Medicine

## 2014-12-15 LAB — ROCKY MTN SPOTTED FVR ABS PNL(IGG+IGM)
RMSF IgG: 2.05 IV — ABNORMAL HIGH
RMSF IgM: 0.24 IV

## 2014-12-15 LAB — B. BURGDORFI ANTIBODIES: B burgdorferi Ab IgG+IgM: 0.34 {ISR}

## 2015-01-19 ENCOUNTER — Emergency Department
Admission: EM | Admit: 2015-01-19 | Discharge: 2015-01-19 | Disposition: A | Discharge status: Home / Self Care | Payer: Federal, State, Local not specified - PPO | Source: Home / Self Care | Attending: Family Medicine | Admitting: Family Medicine

## 2015-01-19 ENCOUNTER — Encounter: Payer: Self-pay | Admitting: *Deleted

## 2015-01-19 ENCOUNTER — Other Ambulatory Visit: Payer: Self-pay | Admitting: Family Medicine

## 2015-01-19 DIAGNOSIS — R5383 Other fatigue: Secondary | ICD-10-CM | POA: Diagnosis not present

## 2015-01-19 LAB — COMPLETE METABOLIC PANEL WITH GFR
ALBUMIN: 4.3 g/dL (ref 3.6–5.1)
ALK PHOS: 66 U/L (ref 40–115)
ALT: 47 U/L — ABNORMAL HIGH (ref 9–46)
AST: 25 U/L (ref 10–40)
BILIRUBIN TOTAL: 0.4 mg/dL (ref 0.2–1.2)
BUN: 8 mg/dL (ref 7–25)
CO2: 26 mmol/L (ref 20–31)
Calcium: 9.3 mg/dL (ref 8.6–10.3)
Chloride: 102 mmol/L (ref 98–110)
Creat: 0.87 mg/dL (ref 0.60–1.35)
GLUCOSE: 98 mg/dL (ref 65–99)
POTASSIUM: 4.3 mmol/L (ref 3.5–5.3)
SODIUM: 141 mmol/L (ref 135–146)
Total Protein: 7.3 g/dL (ref 6.1–8.1)

## 2015-01-19 LAB — CBC WITH DIFFERENTIAL/PLATELET
BASOS PCT: 1 % (ref 0–1)
Basophils Absolute: 0.1 10*3/uL (ref 0.0–0.1)
EOS PCT: 1 % (ref 0–5)
Eosinophils Absolute: 0.1 10*3/uL (ref 0.0–0.7)
HCT: 45.7 % (ref 39.0–52.0)
Hemoglobin: 15.5 g/dL (ref 13.0–17.0)
Lymphocytes Relative: 27 % (ref 12–46)
Lymphs Abs: 1.7 10*3/uL (ref 0.7–4.0)
MCH: 29.3 pg (ref 26.0–34.0)
MCHC: 33.9 g/dL (ref 30.0–36.0)
MCV: 86.4 fL (ref 78.0–100.0)
MONO ABS: 0.4 10*3/uL (ref 0.1–1.0)
MONOS PCT: 7 % (ref 3–12)
MPV: 10.6 fL (ref 8.6–12.4)
Neutro Abs: 4 10*3/uL (ref 1.7–7.7)
Neutrophils Relative %: 64 % (ref 43–77)
Platelets: 297 10*3/uL (ref 150–400)
RBC: 5.29 MIL/uL (ref 4.22–5.81)
RDW: 14.3 % (ref 11.5–15.5)
WBC: 6.3 10*3/uL (ref 4.0–10.5)

## 2015-01-19 LAB — POCT URINALYSIS DIP (MANUAL ENTRY)
BILIRUBIN UA: NEGATIVE
Glucose, UA: NEGATIVE
Ketones, POC UA: NEGATIVE
LEUKOCYTES UA: NEGATIVE
NITRITE UA: NEGATIVE
PH UA: 6 (ref 5–8)
Protein Ur, POC: NEGATIVE
RBC UA: NEGATIVE
Spec Grav, UA: <=1.005 (ref 1.005–1.03)
UROBILINOGEN UA: 0.2 (ref 0–1)

## 2015-01-19 LAB — TSH: TSH: 2.647 u[IU]/mL (ref 0.350–4.500)

## 2015-01-19 NOTE — Discharge Instructions (Signed)

## 2015-01-19 NOTE — ED Notes (Signed)
John Lester reports that his fatigue from his visit 12/14/14 is minimally better.

## 2015-01-19 NOTE — ED Provider Notes (Signed)
CSN: [id number]     Arrival date & time 01/19/15  1058 History   First MD Initiated Contact with Patient 01/19/15 1126     Chief Complaint  Patient presents with  . Fatigue   HPI Comments: The patient presented 4+ weeks ago with acute onset of fatigue, headache, myalgias, chills, and nausea.  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  He was empirically started on a two week course of doxycycline.  His RMSF IgG was elevated, but IgM was negative.  His Lyme disease antibodies were negative. He reports that he felt significantly better after antibiotic treatment for one week.  He states that all symptoms resolved except for mild fatigue, which he describes as being "sluggish." He reports that he continues occasionally discover small embedded ticks.  He denies any development of acute symptoms.  The history is provided by the patient.    History reviewed. No pertinent past medical history. Past Surgical History  Procedure Laterality Date  . Myringotomy    . Adenoidectomy     Family History  Problem Relation Age of Onset  . Hyperlipidemia Father   . Hypertension Father    Social History  Substance Use Topics  . Smoking status: Never Smoker   . Smokeless tobacco: Current User    Types: Snuff  . Alcohol Use: No    Review of Systems  Constitutional: Positive for fatigue. Negative for fever, chills, diaphoresis, activity change, appetite change and unexpected weight change.  Eyes: Negative.   Respiratory: Negative.   Cardiovascular: Negative.   Gastrointestinal: Negative.   Endocrine: Negative.   Genitourinary: Negative.   Musculoskeletal: Positive for myalgias. Negative for arthralgias.  Skin: Negative.   Neurological: Negative for headaches.  Hematological: Negative.   Psychiatric/Behavioral: Negative for sleep disturbance.    Allergies  Review of patient's allergies indicates no known allergies.  Home Medications   Prior to Admission medications   Not  on File   Meds Ordered and Administered this Visit  Medications - No data to display  BP 121/75 mmHg  Pulse 83  Temp(Src) 98.5 F (36.9 C) (Oral)  Resp 16  Ht  (1.854 m)  Wt 221 lb (100.245 kg)  BMI 29.16 kg/m2  SpO2 100% No data found.   Physical Exam Nursing notes and Vital Signs reviewed. Appearance:  Patient appears stated age, and in no acute distress Eyes:  Pupils are equal, round, and reactive to light and accomodation.  Extraocular movement is intact.  Conjunctivae are not inflamed  Ears:  Canals normal.  Tympanic membranes normal.  Nose:   Normal turbinates.  No sinus tenderness.    Pharynx:  Normal Neck:  Supple.  No adenopathy or thryomegaly Lungs:  Clear to auscultation.  Breath sounds are equal.  Moving air well. Heart:  Regular rate and rhythm without murmurs, rubs, or gallops.  Abdomen:  Nontender without masses or hepatosplenomegaly.  Bowel sounds are present.  No CVA or flank tenderness.  Extremities:  No edema.  No calf tenderness Skin:  No rash present.   ED Course  Procedures  None    Labs Reviewed  B. BURGDORFI ANTIBODIES  ROCKY MTN SPOTTED FVR ABS PNL(IGG+IGM)  CBC WITH DIFFERENTIAL/PLATELET  TSH  COMPLETE METABOLIC PANEL WITH GFR  POCT URINALYSIS DIP (MANUAL ENTRY) Normal      MDM   1. Other fatigue; ?etiology    Screening labs:  CMP, CBC/diff, TSH Repeat Lyme disease and [place] Fever antibodies. Followup with PCP  Lattie Haw, MD 01/19/15 1215

## 2015-01-20 LAB — LYME AB/WESTERN BLOT REFLEX: B BURGDORFERI AB IGG+ IGM: 0.34 {ISR}

## 2015-01-20 LAB — ROCKY MTN SPOTTED FVR ABS PNL(IGG+IGM)
RMSF IGG: 2 IV — AB
RMSF IGM: 0.34 IV

## 2015-01-21 ENCOUNTER — Telehealth: Payer: Self-pay | Admitting: *Deleted

## 2015-06-01 ENCOUNTER — Emergency Department
Admission: EM | Admit: 2015-06-01 | Discharge: 2015-06-01 | Disposition: A | Discharge status: Home / Self Care | Payer: Federal, State, Local not specified - PPO | Source: Home / Self Care | Attending: Family Medicine | Admitting: Family Medicine

## 2015-06-01 ENCOUNTER — Encounter: Payer: Self-pay | Admitting: *Deleted

## 2015-06-01 DIAGNOSIS — J0100 Acute maxillary sinusitis, unspecified: Secondary | ICD-10-CM

## 2015-06-01 DIAGNOSIS — J069 Acute upper respiratory infection, unspecified: Secondary | ICD-10-CM | POA: Diagnosis not present

## 2015-06-01 MED ORDER — DM-GUAIFENESIN ER 30-600 MG PO TB12: 1.0000 | ORAL_TABLET | Freq: Two times a day (BID) | ORAL | Status: DC

## 2015-06-01 MED ORDER — DOXYCYCLINE HYCLATE 100 MG PO CAPS: 100.0000 mg | ORAL_CAPSULE | Freq: Two times a day (BID) | ORAL | Status: DC

## 2015-06-01 NOTE — Discharge Instructions (Signed)
You may take 400-600mg Ibuprofen (Motrin) every 6-8 hours for fever and pain  °Alternate with Tylenol  °You may take 500mg Tylenol every 4-6 hours as needed for fever and pain  °Follow-up with your primary care provider next week for recheck of symptoms if not improving.  °Be sure to drink plenty of fluids and rest, at least 8hrs of sleep a night, preferably more while you are sick. °Return urgent care or go to closest ER if you cannot keep down fluids/signs of dehydration, fever not reducing with Tylenol, difficulty breathing/wheezing, stiff neck, worsening condition, or other concerns (see below)  °Please take antibiotics as prescribed and be sure to complete entire course even if you start to feel better to ensure infection does not come back. ° ° °Cool Mist Vaporizers °Vaporizers may help relieve the symptoms of a cough and cold. They add moisture to the air, which helps mucus to become thinner and less sticky. This makes it easier to breathe and cough up secretions. Cool mist vaporizers do not cause serious burns like hot mist vaporizers, which may also be called steamers or humidifiers. Vaporizers have not been proven to help with colds. You should not use a vaporizer if you are allergic to mold. °HOME CARE INSTRUCTIONS °· Follow the package instructions for the vaporizer. °· Do not use anything other than distilled water in the vaporizer. °· Do not run the vaporizer all of the time. This can cause mold or bacteria to grow in the vaporizer. °· Clean the vaporizer after each time it is used. °· Clean and dry the vaporizer well before storing it. °· Stop using the vaporizer if worsening respiratory symptoms develop. °  °This information is not intended to replace advice given to you by your health care provider. Make sure you discuss any questions you have with your health care provider. °  °Document Released: 01/20/2004 Document Revised: 04/29/2013 Document Reviewed: 09/11/2012 °Elsevier Interactive Patient  Education ©2016 [employer] ° °

## 2015-06-01 NOTE — ED Provider Notes (Signed)
CSN: [id number]     Arrival date & time 06/01/15  1319 History   First MD Initiated Contact with Patient 06/01/15 1328     Chief Complaint  Patient presents with  . Cough   (Consider location/radiation/quality/duration/timing/severity/associated sxs/prior Treatment) HPI  Pt is a 35yo male presenting to [facility] with c/o 10 day hx of postnasal drip with sinus congestion and sinus headache, now developing into a moderately productive cough for 1 day.  He did have 2 tabs of leftover amoxicillin and took one  tab last night and states he feels like he is mildly improved.  Denies fever, chills, n/v/d. Denies chest pain, SOB, or hx of asthma.   History reviewed. No pertinent past medical history. Past Surgical History  Procedure Laterality Date  . Myringotomy    . Adenoidectomy     Family History  Problem Relation Age of Onset  . Hyperlipidemia Father   . Hypertension Father    Social History  Substance Use Topics  . Smoking status: Never Smoker   . Smokeless tobacco: Current User    Types: Snuff  . Alcohol Use: No    Review of Systems  Constitutional: Negative for fever and chills.  HENT: Positive for congestion, postnasal drip, rhinorrhea, sinus pressure, sore throat and voice change. Negative for ear pain, sneezing and trouble swallowing.   Respiratory: Positive for cough and wheezing. Negative for shortness of breath.   Cardiovascular: Negative for chest pain and palpitations.  Gastrointestinal: Negative for nausea, vomiting, abdominal pain and diarrhea.  Musculoskeletal: Negative for myalgias, back pain and arthralgias.  Skin: Negative for rash.  Neurological: Positive for dizziness and headaches. Negative for light-headedness.    Allergies  Review of patient's allergies indicates no known allergies.  Home Medications   Prior to Admission medications   Medication Sig Start Date End Date Taking? Authorizing Provider  dextromethorphan-guaiFENesin (MUCINEX DM) 30-600 MG  12hr tablet Take 1 tablet by mouth 2 (two) times daily. Take with large glass of water 06/01/15   Junius Finner, PA-C  doxycycline (VIBRAMYCIN) 100 MG capsule Take 1 capsule (100 mg total) by mouth 2 (two) times daily. One po bid x 7 days 06/01/15   Junius Finner, PA-C   Meds Ordered and Administered this Visit  Medications - No data to display  BP 128/82 mmHg  Pulse 84  Temp(Src) 98.4 F (36.9 C) (Oral)  Resp 16  Ht 6' (1.829 m)  Wt 217 lb (98.431 kg)  BMI 29.42 kg/m2  SpO2 98% No data found.   Physical Exam  Constitutional: He appears well-developed and well-nourished.  HENT:  Head: Normocephalic and atraumatic.  Right Ear: Hearing, external ear and ear canal normal. Tympanic membrane is scarred.  Left Ear: Hearing, tympanic membrane, external ear and ear canal normal.  Nose: Mucosal edema present. Right sinus exhibits maxillary sinus tenderness. Right sinus exhibits no frontal sinus tenderness. Left sinus exhibits maxillary sinus tenderness. Left sinus exhibits no frontal sinus tenderness.  Mouth/Throat: Uvula is midline, oropharynx is clear and moist and mucous membranes are normal.  Eyes: Conjunctivae are normal. No scleral icterus.  Neck: Normal range of motion. Neck supple.  Cardiovascular: Normal rate, regular rhythm and normal heart sounds.   Pulmonary/Chest: Effort normal and breath sounds normal. No respiratory distress. He has no wheezes. He has no rales. He exhibits no tenderness.  Abdominal: Soft. He exhibits no distension. There is no tenderness.  Musculoskeletal: Normal range of motion.  Neurological: He is alert.  Skin: Skin is warm and dry.  Nursing note and vitals reviewed.   ED Course  Procedures (including critical care time)  Labs Review Labs Reviewed - No data to display  Imaging Review No results found.    MDM   1. Acute maxillary sinusitis, recurrence not specified   2. Acute upper respiratory infection    Pt c/o 10 days of worsening sinus  congestion and pressure with developing productive cough for 1 day.   Vitals: WNL  Rx: doxycycline and mucinex DM  Advised pt to use acetaminophen and ibuprofen as needed for fever and pain. Encouraged rest and fluids. F/u with PCP in 7-10 days if not improving, sooner if worsening. Pt verbalized understanding and agreement with tx plan.     Junius Finner, PA-C 06/01/15 1343

## 2015-06-01 NOTE — ED Notes (Signed)
Pt c/o post nasal drip x 10 days, with productive cough x 1 day. Denies fever. He took Amoxicillin  last night.

## 2015-06-05 ENCOUNTER — Telehealth: Payer: Self-pay | Admitting: Emergency Medicine

## 2015-06-15 ENCOUNTER — Encounter: Payer: Self-pay | Admitting: *Deleted

## 2015-06-15 ENCOUNTER — Emergency Department
Admission: EM | Admit: 2015-06-15 | Discharge: 2015-06-15 | Disposition: A | Discharge status: Home / Self Care | Payer: Federal, State, Local not specified - PPO | Source: Home / Self Care | Attending: Family Medicine | Admitting: Family Medicine

## 2015-06-15 DIAGNOSIS — H9202 Otalgia, left ear: Secondary | ICD-10-CM

## 2015-06-15 DIAGNOSIS — H6983 Other specified disorders of Eustachian tube, bilateral: Secondary | ICD-10-CM | POA: Diagnosis not present

## 2015-06-15 DIAGNOSIS — R0981 Nasal congestion: Secondary | ICD-10-CM

## 2015-06-15 MED ORDER — PREDNISONE 20 MG PO TABS: ORAL_TABLET | ORAL | Status: DC

## 2015-06-15 MED ORDER — OXYMETAZOLINE HCL 0.05 % NA SOLN: 1.0000 | Freq: Two times a day (BID) | NASAL | Status: DC

## 2015-06-15 MED ORDER — CEFDINIR 300 MG PO CAPS: 300.0000 mg | ORAL_CAPSULE | Freq: Two times a day (BID) | ORAL | Status: DC

## 2015-06-15 MED ORDER — FLUTICASONE PROPIONATE 50 MCG/ACT NA SUSP: 2.0000 | Freq: Every day | NASAL | Status: DC

## 2015-06-15 MED ORDER — CETIRIZINE HCL 10 MG PO TABS: 10.0000 mg | ORAL_TABLET | Freq: Every day | ORAL | Status: DC

## 2015-06-15 NOTE — ED Provider Notes (Signed)
CSN: [id number]     Arrival date & time 06/15/15  1501 History   First MD Initiated Contact with Patient 06/15/15 1513     Chief Complaint  Patient presents with  . Cough   (Consider location/radiation/quality/duration/timing/severity/associated sxs/prior Treatment) HPI  Pt is a 35yo male presenting to [facility] with c/o Left ear pain and pressure that started about 2 days ago with associated nasal congestion and non-productive cough.  He was seen about 2 weeks ago for similar symptoms and just completed a course of Doxycycline 6 days ago.  He did take a leftover amoxicillin  last night but no relief. Pt has been prescribed Flonase, prednisone, zyrtec, and afrin in the past but he is not using any nasal sprays or allergy medications.  Denies fever, chills, n/v/d. He has not f/u with an ENT provider or PCP for recurrent sinus symptoms.  He did have ear tubes as a child.    History reviewed. No pertinent past medical history. Past Surgical History  Procedure Laterality Date  . Myringotomy    . Adenoidectomy     Family History  Problem Relation Age of Onset  . Hyperlipidemia Father   . Hypertension Father    Social History  Substance Use Topics  . Smoking status: Never Smoker   . Smokeless tobacco: Current User    Types: Snuff  . Alcohol Use: No    Review of Systems  Constitutional: Positive for fever and chills.  HENT: Positive for congestion, ear pain, rhinorrhea, sinus pressure and sore throat. Negative for ear discharge, trouble swallowing and voice change.   Respiratory: Positive for cough. Negative for shortness of breath.   Cardiovascular: Negative for chest pain and palpitations.  Gastrointestinal: Negative for nausea, vomiting, abdominal pain and diarrhea.  Musculoskeletal: Negative for myalgias, back pain and arthralgias.  Skin: Negative for rash.  Neurological: Positive for headaches. Negative for dizziness and light-headedness.  All other systems reviewed and are  negative.   Allergies  Review of patient's allergies indicates no known allergies.  Home Medications   Prior to Admission medications   Medication Sig Start Date End Date Taking? Authorizing Provider  cefdinir (OMNICEF) 300 MG capsule Take 1 capsule (300 mg total) by mouth 2 (two) times daily. For 10 days 06/15/15   Junius Finner, PA-C  cetirizine (ZYRTEC) 10 MG tablet Take 1 tablet (10 mg total) by mouth daily. 06/15/15   Junius Finner, PA-C  fluticasone (FLONASE) 50 MCG/ACT nasal spray Place 2 sprays into both nostrils daily. 06/15/15   Junius Finner, PA-C  oxymetazoline (AFRIN NASAL SPRAY) 0.05 % nasal spray Place 1 spray into both nostrils 2 (two) times daily. For 4 days when sick. Do not use for longer than prescribed as symptoms may worsen. 06/15/15   Junius Finner, PA-C  predniSONE (DELTASONE) 20 MG tablet 3 tabs po day one, then 2 po daily x 4 days 06/15/15   Junius Finner, PA-C   Meds Ordered and Administered this Visit  Medications - No data to display  BP 121/79 mmHg  Pulse 95  Temp(Src) 98.5 F (36.9 C) (Oral)  Resp 18  Ht 6' (1.829 m)  Wt 215 lb (97.523 kg)  BMI 29.15 kg/m2  SpO2 100% No data found.   Physical Exam  Constitutional: He appears well-developed and well-nourished.  HENT:  Head: Normocephalic and atraumatic.  Right Ear: Hearing, external ear and ear canal normal. No drainage. Tympanic membrane is injected and scarred. Tympanic membrane is not bulging.  Left Ear: Hearing, external ear and  ear canal normal. No drainage. Tympanic membrane is scarred, erythematous and bulging.  Nose: Mucosal edema and rhinorrhea present. Right sinus exhibits no maxillary sinus tenderness and no frontal sinus tenderness. Left sinus exhibits maxillary sinus tenderness. Left sinus exhibits no frontal sinus tenderness.  Mouth/Throat: Uvula is midline and mucous membranes are normal. Posterior oropharyngeal erythema present. No oropharyngeal exudate, posterior oropharyngeal edema or  tonsillar abscesses.  Eyes: Conjunctivae are normal. No scleral icterus.  Neck: Normal range of motion. Neck supple.  Cardiovascular: Normal rate, regular rhythm and normal heart sounds.   Pulmonary/Chest: Effort normal and breath sounds normal. No stridor. No respiratory distress. He has no wheezes. He has no rales. He exhibits no tenderness.  Abdominal: Soft. He exhibits no distension and no mass. There is no tenderness. There is no rebound and no guarding.  Musculoskeletal: Normal range of motion.  Lymphadenopathy:    He has no cervical adenopathy.  Neurological: He is alert.  Skin: Skin is warm and dry.  Nursing note and vitals reviewed.   ED Course  Procedures (including critical care time)  Labs Review Labs Reviewed - No data to display  Imaging Review No results found.  Tympanometry: Right ear- wide tympanogram.  Left ear- negative tympanometric peak pressure c/w eustachian tube dysfunction  MDM   1. Otalgia, left   2. Eustachian tube dysfunction, bilateral   3. Nasal congestion    Pt c/o recurrent nasal congestion, sinus pressure, cough and ear pain.    Exam c/w early Left AOM Strongly encouraged pt to establish care with a  PCP and ENT as pt would likely benefit from sinus surgery or at the minimum ongoing symptomatic control of chronic nasal congestion, likely due to allergies.  Rx: cefdinir, Flonase, Afrin, Prednisone, and zyrtec  Advised pt to use acetaminophen and ibuprofen as needed for fever and pain. Encouraged rest and fluids. F/u with PCP in 7-10 days if not improving, sooner if worsening. Pt verbalized understanding and agreement with tx plan.     Junius Finner, PA-C 06/15/15 1635

## 2015-06-15 NOTE — ED Notes (Signed)
Pt c/o LT ear pain, nasal congestion and nonproductive cough x 2 days. Finished Doxycyline 6 days ago. Took Amoxicillin 875 mg last night.

## 2015-06-15 NOTE — Discharge Instructions (Signed)

## 2018-10-10 DIAGNOSIS — L7211 Pilar cyst: Secondary | ICD-10-CM | POA: Diagnosis not present

## 2018-10-10 DIAGNOSIS — L720 Epidermal cyst: Secondary | ICD-10-CM | POA: Diagnosis not present

## 2019-06-06 ENCOUNTER — Other Ambulatory Visit: Payer: Self-pay

## 2019-06-06 ENCOUNTER — Emergency Department
Admission: EM | Admit: 2019-06-06 | Discharge: 2019-06-06 | Disposition: A | Discharge status: Home / Self Care | Payer: Federal, State, Local not specified - PPO | Source: Home / Self Care | Attending: Family Medicine | Admitting: Family Medicine

## 2019-06-06 ENCOUNTER — Emergency Department (INDEPENDENT_AMBULATORY_CARE_PROVIDER_SITE_OTHER): Payer: Federal, State, Local not specified - PPO

## 2019-06-06 DIAGNOSIS — W010XXA Fall on same level from slipping, tripping and stumbling without subsequent striking against object, initial encounter: Secondary | ICD-10-CM

## 2019-06-06 DIAGNOSIS — S83411A Sprain of medial collateral ligament of right knee, initial encounter: Secondary | ICD-10-CM | POA: Diagnosis not present

## 2019-06-06 DIAGNOSIS — S8991XA Unspecified injury of right lower leg, initial encounter: Secondary | ICD-10-CM

## 2019-06-06 MED ORDER — IBUPROFEN 800 MG PO TABS: ORAL_TABLET | ORAL | 1 refills | Status: DC

## 2019-06-06 NOTE — Discharge Instructions (Addendum)
Apply ice pack for 30 minutes every three to four times daily until swelling resolves. Wear brace for about 2 to 3 weeks.  Begin range of motion and stretching exercises in about 5 days as per instruction sheet.  May take Ibuprofen 200mg , 4 tabs every 8 hours with food.

## 2019-06-06 NOTE — ED Provider Notes (Signed)
[facility]    CSN: [id number] Arrival date & time: 06/06/19  1203      History   Chief Complaint Chief Complaint  Patient presents with  . Knee Pain    HPI John Lester is a 39 y.o. male.   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  As he landed on his right knee, he felt/heard a painful popping sensation.  He has had persistent pain with weight-bearing.  The history is provided by the patient.  Knee Pain Location:  Knee Time since incident:  1 day Injury: yes   Mechanism of injury: fall   Fall:    Fall occurred: while feeding cows.   Impact surface:  Dirt Knee location:  R knee Pain details:    Quality:  Aching   Radiates to:  Does not radiate   Severity:  Moderate   Onset quality:  Sudden   Duration:  1 day   Timing:  Constant   Progression:  Unchanged Chronicity:  New Prior injury to area:  No Relieved by:  None tried Worsened by:  Bearing weight Ineffective treatments:  NSAIDs Associated symptoms: decreased ROM, stiffness and swelling   Associated symptoms: no back pain, no muscle weakness, no numbness and no tingling     History reviewed. No pertinent past medical history.  Patient Active Problem List   Diagnosis Date Noted  . 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 ADENOIDECTOMY    . MYRINGOTOMY         Home Medications    Prior to Admission medications   Medication Sig Start Date End Date Taking? Authorizing Provider  cefdinir (OMNICEF) 300 MG capsule Take 1 capsule (300 mg total) by mouth 2 (two) times daily. For 10 days 06/15/15   Lurene Shadow, PA-C  cetirizine (ZYRTEC) 10 MG tablet Take 1 tablet (10 mg total) by mouth daily. 06/15/15   Lurene Shadow, PA-C  fluticasone (FLONASE) 50 MCG/ACT nasal spray Place 2 sprays into both nostrils daily. 06/15/15   Lurene Shadow, PA-C  ibuprofen (ADVIL) 800 MG tablet Take one tab PO Q8hr PC 06/06/19   Lattie Haw, MD  oxymetazoline (AFRIN NASAL SPRAY) 0.05 % nasal spray Place 1 spray into both nostrils 2 (two) times daily. For 4 days when sick. Do not use for longer than prescribed as symptoms may worsen. 06/15/15   Lurene Shadow, PA-C  predniSONE (DELTASONE) 20 MG tablet 3 tabs po day one, then 2 po daily x 4 days 06/15/15   Lurene Shadow, PA-C    Family History Family History  Problem Relation Age of Onset  . Hyperlipidemia Father   . Hypertension Father     Social History Social History   Tobacco Use  . Smoking status: Never Smoker  . Smokeless tobacco: Current User    Types: Snuff  Substance Use Topics  . Alcohol use: No  . Drug use: No     Allergies   Patient has no known allergies.   Review of Systems Review of Systems  Musculoskeletal: Positive for stiffness. Negative for back pain.  All other systems reviewed and are negative.    Physical Exam Triage Vital Signs ED Triage Vitals  Enc Vitals Group     BP 06/06/19 1258 (!) 144/87     Pulse Rate 06/06/19 1258 76     Resp 06/06/19 1258 20     Temp 06/06/19 1258  98.5 F (36.9 C)     Temp Source 06/06/19 1258 Oral     SpO2 06/06/19 1258 99 %     Weight 06/06/19 1259 223 lb (101.2 kg)     Height 06/06/19 1259 6' (1.829 m)     Head Circumference --      Peak Flow --      Pain Score 06/06/19 1259 7     Pain Loc --      Pain Edu? --      Excl. in GC? --    No data found.  Updated Vital Signs BP (!) 144/87 (BP Location: Right Arm)   Pulse 76   Temp 98.5 F (36.9 C) (Oral)   Resp 20   Ht 6' (1.829 m)   Wt 101.2 kg   SpO2 99%   BMI 30.24 kg/m   Visual Acuity Right Eye Distance:   Left Eye Distance:   Bilateral Distance:    Right Eye Near:   Left Eye Near:    Bilateral Near:     Physical Exam Vitals and nursing note reviewed.  Constitutional:      General: He is not in acute distress. HENT:     Head: Normocephalic.  Eyes:     Pupils: Pupils are equal, round, and reactive to light.    Cardiovascular:     Rate and Rhythm: Normal rate.  Pulmonary:     Effort: Pulmonary effort is normal.  Musculoskeletal:     Cervical back: Normal range of motion.     Right knee: No swelling or erythema. Decreased range of motion. Tenderness present over the MCL. Normal meniscus and normal patellar mobility.     Instability Tests: Negative medial McMurray test and negative lateral McMurray test.  Skin:    General: Skin is warm and dry.  Neurological:     Mental Status: He is alert.      UC Treatments / Results  Labs (all labs ordered are listed, but only abnormal results are displayed) Labs Reviewed - No data to display  EKG   Radiology DG Knee Complete 4 Views Right  Result Date: 06/06/2019 CLINICAL DATA:  Slipped and fell yesterday with right knee injury. EXAM: RIGHT KNEE - COMPLETE 4+ VIEW COMPARISON:  None. FINDINGS: No evidence of fracture, dislocation, or joint effusion. No evidence of arthropathy or other focal bone abnormality. Soft tissues are unremarkable. IMPRESSION: Negative. Electronically Signed   By: Elberta Fortis M.D.   On: 06/06/2019 13:32    Procedures Procedures (including critical care time)  Medications Ordered in UC Medications - No data to display  Initial Impression / Assessment and Plan / UC Course  I have reviewed the triage vital signs and the nursing notes.  Pertinent labs & imaging results that were available during my care of the patient were reviewed by me and considered in my medical decision making (see chart for details).    Dispensed hinged knee brace.  Rx for ibuprofen 800mg . Followup with Dr. ([facility]) if not improving about two weeks.    Final Clinical Impressions(s) / UC Diagnoses   Final diagnoses:  Sprain of medial collateral ligament of right knee, initial encounter     Discharge Instructions     Apply ice pack for 30 minutes every three to four times daily until swelling resolves.  Wear brace for about 2 to 3 weeks.  Begin range of motion and stretching exercises in about 5 days as per instruction sheet.  May take  Ibuprofen 200mg , 4 tabs every 8 hours with food.      ED Prescriptions    Medication Sig Dispense Auth. Provider   ibuprofen (ADVIL) 800 MG tablet Take one tab PO Q8hr PC 30 tablet Kandra Nicolas, MD        Kandra Nicolas, MD 06/13/19 1451

## 2019-06-06 NOTE — ED Triage Notes (Signed)
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  Knee feels tight.

## 2019-09-10 DIAGNOSIS — Z03818 Encounter for observation for suspected exposure to other biological agents ruled out: Secondary | ICD-10-CM | POA: Diagnosis not present

## 2019-09-10 DIAGNOSIS — J019 Acute sinusitis, unspecified: Secondary | ICD-10-CM | POA: Diagnosis not present

## 2019-09-23 DIAGNOSIS — R519 Headache, unspecified: Secondary | ICD-10-CM | POA: Diagnosis not present

## 2019-09-23 DIAGNOSIS — J209 Acute bronchitis, unspecified: Secondary | ICD-10-CM | POA: Diagnosis not present

## 2019-09-23 DIAGNOSIS — J019 Acute sinusitis, unspecified: Secondary | ICD-10-CM | POA: Diagnosis not present

## 2020-01-07 DIAGNOSIS — R109 Unspecified abdominal pain: Secondary | ICD-10-CM | POA: Diagnosis not present

## 2020-01-07 DIAGNOSIS — R197 Diarrhea, unspecified: Secondary | ICD-10-CM | POA: Diagnosis not present

## 2020-03-24 DIAGNOSIS — R0789 Other chest pain: Secondary | ICD-10-CM | POA: Diagnosis not present

## 2020-03-24 DIAGNOSIS — Z03818 Encounter for observation for suspected exposure to other biological agents ruled out: Secondary | ICD-10-CM | POA: Diagnosis not present

## 2020-08-03 ENCOUNTER — Ambulatory Visit (INDEPENDENT_AMBULATORY_CARE_PROVIDER_SITE_OTHER): Payer: Federal, State, Local not specified - PPO | Admitting: Medical-Surgical

## 2020-08-03 ENCOUNTER — Other Ambulatory Visit: Payer: Self-pay

## 2020-08-03 ENCOUNTER — Encounter: Payer: Self-pay | Admitting: Medical-Surgical

## 2020-08-03 ENCOUNTER — Ambulatory Visit (INDEPENDENT_AMBULATORY_CARE_PROVIDER_SITE_OTHER): Payer: Federal, State, Local not specified - PPO

## 2020-08-03 VITALS — BP 138/79 | HR 80 | Temp 99.3°F | Ht 71.5 in | Wt 224.4 lb

## 2020-08-03 DIAGNOSIS — Z7689 Persons encountering health services in other specified circumstances: Secondary | ICD-10-CM

## 2020-08-03 DIAGNOSIS — Z1159 Encounter for screening for other viral diseases: Secondary | ICD-10-CM

## 2020-08-03 DIAGNOSIS — M25561 Pain in right knee: Secondary | ICD-10-CM

## 2020-08-03 DIAGNOSIS — M1711 Unilateral primary osteoarthritis, right knee: Secondary | ICD-10-CM | POA: Diagnosis not present

## 2020-08-03 DIAGNOSIS — R531 Weakness: Secondary | ICD-10-CM | POA: Diagnosis not present

## 2020-08-03 DIAGNOSIS — G8929 Other chronic pain: Secondary | ICD-10-CM

## 2020-08-03 DIAGNOSIS — Z0000 Encounter for general adult medical examination without abnormal findings: Secondary | ICD-10-CM | POA: Diagnosis not present

## 2020-08-03 DIAGNOSIS — Z114 Encounter for screening for human immunodeficiency virus [HIV]: Secondary | ICD-10-CM

## 2020-08-03 NOTE — Progress Notes (Signed)
New Patient Office Visit  Subjective:  Patient ID: John Lester, male    DOB: 02/19/1981  Age: 40 y.o. MRN: [id number]  CC:  Chief Complaint  Patient presents with  . Establish Care    HPI John Lester presents to establish care.   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  Notes that his job is very heavy on physical activity requirements, requiring long hours standing walking.  A knee injury last year that his not fully resolved.  Of his right knee still occasionally hurts in the musculature feels different in his leg.  At times, if he is walking, the knee will suddenly give way and feel weak without warning.  He had x-rays last year but has not had further imaging and has physical therapy or specific treatments.  Notes that he developed a a hernia on the right side.  His symptoms involve pain in the right inguinal area most notable after eating when he coughs.  There is no bulge present and he is not counting any tender spots or lumps/bumps.  Past Medical History:  Diagnosis Date  . Asthma     Past Surgical History:  Procedure Laterality Date  . ADENOIDECTOMY    . MYRINGOTOMY      Family History  Problem Relation Age of Onset  . Hyperlipidemia Father   . Hypertension Father   . Hypertension Mother   . Diabetes Mother   . Hypertension Maternal Grandmother   . Hypertension Maternal Grandfather   . Diabetes Maternal Grandfather   . Hypertension Paternal Grandmother   . Hypertension Paternal Grandfather     Social History   Socioeconomic History  . Marital status: Single    Spouse name: Not on file  . Number of children: Not on file  . Years of education: Not on file  . Highest education level: Not on file  Occupational History  . Not on file  Tobacco Use  . Smoking status: Never Smoker  . Smokeless tobacco: Former Neurosurgeon    Types: Snuff  Vaping Use  . Vaping Use: Never used  Substance and Sexual Activity  . Alcohol  use: Yes    Comment: rarely  . Drug use: Never  . Sexual activity: Yes    Partners: Female    Birth control/protection: Condom  Other Topics Concern  . Not on file  Social History Narrative  . Not on file   Social Determinants of Health   Financial Resource Strain: Not on file  Food Insecurity: Not on file  Transportation Needs: Not on file  Physical Activity: Not on file  Stress: Not on file  Social Connections: Not on file  Intimate Partner Violence: Not on file    ROS Review of Systems  Constitutional: Negative for chills, fatigue and fever.  Respiratory: Negative for cough, chest tightness, shortness of breath and wheezing.   Cardiovascular: Negative for chest pain, palpitations and leg swelling.  Musculoskeletal: Positive for arthralgias (right knee).  Allergic/Immunologic: Positive for environmental allergies and food allergies (prior alphagal panel).  Neurological: Negative for dizziness, light-headedness and headaches.  Psychiatric/Behavioral: Positive for sleep disturbance. Negative for dysphoric mood, self-injury and suicidal ideas. The patient is nervous/anxious (job related).     Objective:   Today's Vitals: BP 138/79   Pulse 80   Temp 99.3 F (37.4 C)   Ht 5' 11.5" (1.816 m)   Wt 224 lb 6.4 oz (101.8 kg)   SpO2 98%  BMI 30.86 kg/m   Physical Exam Vitals and nursing note reviewed.  Constitutional:      General: He is not in acute distress.    Appearance: Normal appearance.  HENT:     Head: Normocephalic and atraumatic.  Cardiovascular:     Rate and Rhythm: Normal rate and regular rhythm.     Pulses: Normal pulses.     Heart sounds: Normal heart sounds. No murmur heard. No friction rub. No gallop.   Pulmonary:     Effort: Pulmonary effort is normal. No respiratory distress.     Breath sounds: Normal breath sounds.  Skin:    General: Skin is warm and dry.  Neurological:     Mental Status: He is alert and oriented to person, place, and time.   Psychiatric:        Mood and Affect: Mood normal.        Behavior: Behavior normal.        Thought Content: Thought content normal.        Judgment: Judgment normal.     Assessment & Plan:   1. Encounter to establish care Reviewed available information and discussed healthcare concerns with patient.  2. Preventative health care Since he has had no blood work in several years, we will go ahead and get preventative care labs today with CBC with differential, CMP, and lipid panel. - CBC with Differential/Platelet - COMPLETE METABOLIC PANEL WITH GFR - Lipid panel  3. Chronic pain of right knee Getting updated x-rays of his right knee.  If my suspicions are correct, he likely has some soft tissue damage in will require physical therapy/MRI. - DG Knee Complete 4 Views Right; Future   Outpatient Encounter Medications as of 08/03/2020  Medication Sig  . [DISCONTINUED] cefdinir (OMNICEF) 300 MG capsule Take 1 capsule (300 mg total) by mouth 2 (two) times daily. For 10 days  . [DISCONTINUED] cetirizine (ZYRTEC) 10 MG tablet Take 1 tablet (10 mg total) by mouth daily.  . [DISCONTINUED] fluticasone (FLONASE) 50 MCG/ACT nasal spray Place 2 sprays into both nostrils daily.  . [DISCONTINUED] ibuprofen (ADVIL) 800 MG tablet Take one tab PO Q8hr PC  . [DISCONTINUED] oxymetazoline (AFRIN NASAL SPRAY) 0.05 % nasal spray Place 1 spray into both nostrils 2 (two) times daily. For 4 days when sick. Do not use for longer than prescribed as symptoms may worsen.  . [DISCONTINUED] predniSONE (DELTASONE) 20 MG tablet 3 tabs po day one, then 2 po daily x 4 days   No facility-administered encounter medications on file as of 08/03/2020.    Follow-up: Return if symptoms worsen or fail to improve.  Follow-up pending lab and imaging results  Thayer Ohm, DNP, APRN, FNP-BC [facility]

## 2020-08-04 DIAGNOSIS — Z0000 Encounter for general adult medical examination without abnormal findings: Secondary | ICD-10-CM | POA: Diagnosis not present

## 2020-08-04 LAB — LIPID PANEL
Cholesterol: 131 mg/dL (ref <200)
HDL: 45 mg/dL (ref >=40)
LDL Cholesterol (Calc): 63 mg/dL (calc)
Non-HDL Cholesterol (Calc): 86 mg/dL (calc) (ref <130)
Total CHOL/HDL Ratio: 2.9 (calc) (ref <5.0)
Triglycerides: 142 mg/dL (ref <150)

## 2020-08-04 LAB — CBC WITH DIFFERENTIAL/PLATELET
Absolute Monocytes: 517 cells/uL (ref 200–950)
Basophils Absolute: 50 cells/uL (ref 0–200)
Basophils Relative: 0.9 %
Eosinophils Absolute: 61 cells/uL (ref 15–500)
Eosinophils Relative: 1.1 %
HCT: 45.8 % (ref 38.5–50.0)
Hemoglobin: 15.6 g/dL (ref 13.2–17.1)
Lymphs Abs: 1617 cells/uL (ref 850–3900)
MCH: 29.7 pg (ref 27.0–33.0)
MCHC: 34.1 g/dL (ref 32.0–36.0)
MCV: 87.2 fL (ref 80.0–100.0)
MPV: 10.8 fL (ref 7.5–12.5)
Monocytes Relative: 9.4 %
Neutro Abs: 3256 cells/uL (ref 1500–7800)
Neutrophils Relative %: 59.2 %
Platelets: 268 10*3/uL (ref 140–400)
RBC: 5.25 10*6/uL (ref 4.20–5.80)
RDW: 13.1 % (ref 11.0–15.0)
Total Lymphocyte: 29.4 %
WBC: 5.5 10*3/uL (ref 3.8–10.8)

## 2020-08-04 LAB — COMPLETE METABOLIC PANEL WITH GFR
AG Ratio: 1.5 (calc) (ref 1.0–2.5)
ALT: 41 U/L (ref 9–46)
AST: 23 U/L (ref 10–40)
Albumin: 4.3 g/dL (ref 3.6–5.1)
Alkaline phosphatase (APISO): 71 U/L (ref 36–130)
BUN: 10 mg/dL (ref 7–25)
CO2: 29 mmol/L (ref 20–32)
Calcium: 9.9 mg/dL (ref 8.6–10.3)
Chloride: 101 mmol/L (ref 98–110)
Creat: 1.12 mg/dL (ref 0.60–1.35)
GFR, Est African American: 95 mL/min/{1.73_m2} (ref >=60)
GFR, Est Non African American: 82 mL/min/{1.73_m2} (ref >=60)
Globulin: 2.8 g/dL (calc) (ref 1.9–3.7)
Glucose, Bld: 95 mg/dL (ref 65–99)
Potassium: 4.6 mmol/L (ref 3.5–5.3)
Sodium: 137 mmol/L (ref 135–146)
Total Bilirubin: 0.5 mg/dL (ref 0.2–1.2)
Total Protein: 7.1 g/dL (ref 6.1–8.1)

## 2020-08-05 ENCOUNTER — Other Ambulatory Visit: Payer: Self-pay | Admitting: Medical-Surgical

## 2020-08-05 DIAGNOSIS — M25361 Other instability, right knee: Secondary | ICD-10-CM

## 2020-08-05 DIAGNOSIS — G8929 Other chronic pain: Secondary | ICD-10-CM

## 2020-08-14 ENCOUNTER — Other Ambulatory Visit: Payer: Self-pay

## 2020-08-14 ENCOUNTER — Ambulatory Visit (INDEPENDENT_AMBULATORY_CARE_PROVIDER_SITE_OTHER): Payer: Federal, State, Local not specified - PPO

## 2020-08-14 DIAGNOSIS — S83411A Sprain of medial collateral ligament of right knee, initial encounter: Secondary | ICD-10-CM | POA: Diagnosis not present

## 2020-08-14 DIAGNOSIS — M25461 Effusion, right knee: Secondary | ICD-10-CM | POA: Diagnosis not present

## 2020-08-14 DIAGNOSIS — M25361 Other instability, right knee: Secondary | ICD-10-CM

## 2020-08-14 DIAGNOSIS — G8929 Other chronic pain: Secondary | ICD-10-CM

## 2020-08-14 DIAGNOSIS — S83411D Sprain of medial collateral ligament of right knee, subsequent encounter: Secondary | ICD-10-CM | POA: Diagnosis not present

## 2020-08-14 DIAGNOSIS — M25561 Pain in right knee: Secondary | ICD-10-CM

## 2020-08-14 DIAGNOSIS — R600 Localized edema: Secondary | ICD-10-CM

## 2020-09-01 ENCOUNTER — Other Ambulatory Visit: Payer: Self-pay

## 2020-09-01 ENCOUNTER — Encounter: Payer: Self-pay | Admitting: Medical-Surgical

## 2020-09-01 ENCOUNTER — Ambulatory Visit: Payer: Federal, State, Local not specified - PPO | Admitting: Medical-Surgical

## 2020-09-01 VITALS — BP 129/75 | HR 82 | Temp 99.1°F | Ht 71.5 in | Wt 222.3 lb

## 2020-09-01 DIAGNOSIS — M25561 Pain in right knee: Secondary | ICD-10-CM

## 2020-09-01 DIAGNOSIS — G8929 Other chronic pain: Secondary | ICD-10-CM

## 2020-09-01 DIAGNOSIS — M25361 Other instability, right knee: Secondary | ICD-10-CM | POA: Diagnosis not present

## 2020-09-01 NOTE — Patient Instructions (Signed)
Will recommend activity modification and limited walking for rehab/physical therapy and treatment of right knee injury. Please have employer send over FMLA and/or Short term disability paperwork if needed. Plan for return after 3-4 weeks.

## 2020-09-01 NOTE — Progress Notes (Signed)
Subjective:    CC: follow up on right knee pain/MRI  HPI: Pleasant 40 year old male presenting today for follow-up on right knee pain after having an MRI completed.  MRI findings included thickening of the MCL, a suspected osteochondral defect, and edema of the superior lateral aspect of Hoffa's fat pad that is possibly related to patellar tendon-lateral femoral condyle friction syndrome.  Continues to have intermittent discomfort of the right knee.  He does have a brace at home that he was using for a couple of months but felt like his leg muscles were getting weaker after wearing it all day every day.  Has had episodes where the knee gives way on him without warning.  As he works for the [employer] and is required to be on his feet for long hours every day, his job does not have any activity modifications that will allow him to still perform his regular duties.  I reviewed the past medical history, family history, social history, surgical history, and allergies today and no changes were needed.  Please see the problem list section below in epic for further details.  Past Medical History: Past Medical History:  Diagnosis Date  . Asthma    Past Surgical History: Past Surgical History:  Procedure Laterality Date  . ADENOIDECTOMY    . MYRINGOTOMY     Social History: Social History   Socioeconomic History  . Marital status: Single    Spouse name: Not on file  . Number of children: Not on file  . Years of education: Not on file  . Highest education level: Not on file  Occupational History  . Not on file  Tobacco Use  . Smoking status: Never Smoker  . Smokeless tobacco: Former Neurosurgeon    Types: Snuff  Vaping Use  . Vaping Use: Never used  Substance and Sexual Activity  . Alcohol use: Yes    Comment: rarely  . Drug use: Never  . Sexual activity: Yes    Partners: Female    Birth control/protection: Condom  Other Topics Concern  . Not on file  Social History Narrative  . Not on  file   Social Determinants of Health   Financial Resource Strain: Not on file  Food Insecurity: Not on file  Transportation Needs: Not on file  Physical Activity: Not on file  Stress: Not on file  Social Connections: Not on file   Family History: Family History  Problem Relation Age of Onset  . Hyperlipidemia Father   . Hypertension Father   . Hypertension Mother   . Diabetes Mother   . Hypertension Maternal Grandmother   . Hypertension Maternal Grandfather   . Diabetes Maternal Grandfather   . Hypertension Paternal Grandmother   . Hypertension Paternal Grandfather    Allergies: No Known Allergies Medications: See med rec.  Review of Systems: See HPI for pertinent positives and negatives.   Objective:    General: Well Developed, well nourished, and in no acute distress.  Neuro: Alert and oriented x3.  HEENT: Normocephalic, atraumatic.  Skin: Warm and dry. Cardiac: Regular rate and rhythm, no murmurs rubs or gallops, no lower extremity edema.  Respiratory: Clear to auscultation bilaterally. Not using accessory muscles, speaking in full sentences.  Impression and Recommendations:    1. Chronic pain of right knee 2. Knee instability, right Discussed findings of the MRI with patient.  He does have a knee brace at home that I have advised him to wear if he is having a particularly bad day.  Recommend physical therapy.  He would like to do home exercises rather than going in for in person physical therapy.  Recommend activity modifications for the next 3 to 4 weeks while working on strengthening exercises of the knee.  If his job is not able to accommodate these limitations, advised him to have them send FMLA and/or short-term disability forms to our office for completion.  Return in 4 weeks (on 09/29/2020) for Right knee pain and instability follow-up.___________________________________________ Thayer Ohm, DNP, APRN, FNP-BC Primary Care and Sports Medicine [facility] [place]

## 2020-09-14 ENCOUNTER — Telehealth: Payer: Self-pay

## 2020-09-14 NOTE — Telephone Encounter (Signed)
Pt dropped paperwork off for FMLA . Pt stated that he needs this paperwork done as soon as possible. I did let the pt know that it can take up to 5 to 7 business days. He was okay with that. He also would like to be called as soon as the paperwork is complete. Paperwork was left in the message box. tvt

## 2020-09-14 NOTE — Telephone Encounter (Signed)
Please contact patient to see what his first day missed was and when he feels like he can go back to regular work duties.

## 2020-09-17 NOTE — Telephone Encounter (Signed)
Pt states his first day missed was 09/01/20 and he feels like he will be able to go back on 09/30/20 (the day after his follow up appointment with you).

## 2020-09-17 NOTE — Telephone Encounter (Signed)
Pt aware forms have been completed, faxed, and are ready for pick up. Pt aware a copy of the fax confirmation form is attached to the original forms. A copy has been placed in the drawer at [employer], a copy is in the faxed bin, and a copy has been sent to scanning.

## 2020-09-17 NOTE — Telephone Encounter (Signed)
FMLA paperwork completed and placed in Kristen's box for faxing. Please let patient know the paperwork (originals) are available for pick up at his convenience.

## 2020-09-29 ENCOUNTER — Encounter: Payer: Self-pay | Admitting: Medical-Surgical

## 2020-09-29 ENCOUNTER — Other Ambulatory Visit: Payer: Self-pay

## 2020-09-29 ENCOUNTER — Ambulatory Visit: Payer: Federal, State, Local not specified - PPO | Admitting: Medical-Surgical

## 2020-09-29 VITALS — BP 121/82 | HR 70 | Temp 98.8°F | Ht 71.5 in | Wt 224.6 lb

## 2020-09-29 DIAGNOSIS — Z1159 Encounter for screening for other viral diseases: Secondary | ICD-10-CM

## 2020-09-29 DIAGNOSIS — M25361 Other instability, right knee: Secondary | ICD-10-CM

## 2020-09-29 DIAGNOSIS — Z114 Encounter for screening for human immunodeficiency virus [HIV]: Secondary | ICD-10-CM | POA: Diagnosis not present

## 2020-09-29 DIAGNOSIS — M25561 Pain in right knee: Secondary | ICD-10-CM

## 2020-09-29 DIAGNOSIS — G8929 Other chronic pain: Secondary | ICD-10-CM

## 2020-09-29 NOTE — Progress Notes (Signed)
  Subjective:    CC: knee pain follow up  HPI: Pleasant 40 year old male presenting today for follow up on chronic knee pain. Has been limiting his activity and doing home exercises as instructed. Reports that his knee pain his improved and he is feeling ready to return to work without restrictions. Had a recent bout of viral symptoms but this has resolved. He did not test for COVID or seek medical evaluation.   I reviewed the past medical history, family history, social history, surgical history, and allergies today and no changes were needed.  Please see the problem list section below in epic for further details.  Past Medical History: Past Medical History:  Diagnosis Date  . Asthma    Past Surgical History: Past Surgical History:  Procedure Laterality Date  . ADENOIDECTOMY    . MYRINGOTOMY     Social History: Social History   Socioeconomic History  . Marital status: Single    Spouse name: Not on file  . Number of children: Not on file  . Years of education: Not on file  . Highest education level: Not on file  Occupational History  . Not on file  Tobacco Use  . Smoking status: Never Smoker  . Smokeless tobacco: Former Neurosurgeon    Types: Snuff  Vaping Use  . Vaping Use: Never used  Substance and Sexual Activity  . Alcohol use: Yes    Comment: rarely  . Drug use: Never  . Sexual activity: Yes    Partners: Female    Birth control/protection: Condom  Other Topics Concern  . Not on file  Social History Narrative  . Not on file   Social Determinants of Health   Financial Resource Strain: Not on file  Food Insecurity: Not on file  Transportation Needs: Not on file  Physical Activity: Not on file  Stress: Not on file  Social Connections: Not on file   Family History: Family History  Problem Relation Age of Onset  . Hyperlipidemia Father   . Hypertension Father   . Hypertension Mother   . Diabetes Mother   . Hypertension Maternal Grandmother   . Hypertension  Maternal Grandfather   . Diabetes Maternal Grandfather   . Hypertension Paternal Grandmother   . Hypertension Paternal Grandfather    Allergies: No Known Allergies Medications: See med rec.  Review of Systems: See HPI for pertinent positives and negatives.   Objective:    General: Well Developed, well nourished, and in no acute distress.  Neuro: Alert and oriented x3.  HEENT: Normocephalic, atraumatic.  Skin: Warm and dry. Cardiac: Regular rate and rhythm, no murmurs rubs or gallops, no lower extremity edema.  Respiratory: Clear to auscultation bilaterally. Not using accessory muscles, speaking in full sentences.   Impression and Recommendations:    1. Chronic pain of right knee 2. Knee instability, right Doing much better after several weeks of home exercises and activity limitations. Feels that he is ready to go back to work full time. Work note to return to full duty without restrictions provided. Will continue home exercises and conservative treatment to manage chronic pain.   Return in about 1 year (around 09/29/2021) for annual physical exam or sooner if needed. ___________________________________________ Thayer Ohm, DNP, APRN, FNP-BC Primary Care and Sports Medicine [facility] [place]

## 2020-10-27 DIAGNOSIS — J329 Chronic sinusitis, unspecified: Secondary | ICD-10-CM | POA: Diagnosis not present

## 2020-10-27 DIAGNOSIS — H10023 Other mucopurulent conjunctivitis, bilateral: Secondary | ICD-10-CM | POA: Diagnosis not present

## 2020-10-27 DIAGNOSIS — Z6831 Body mass index (BMI) 31.0-31.9, adult: Secondary | ICD-10-CM | POA: Diagnosis not present

## 2021-01-24 ENCOUNTER — Encounter: Payer: Self-pay | Admitting: Medical-Surgical

## 2021-01-24 ENCOUNTER — Ambulatory Visit: Payer: Federal, State, Local not specified - PPO | Admitting: Medical-Surgical

## 2021-01-24 ENCOUNTER — Other Ambulatory Visit: Payer: Self-pay

## 2021-01-24 VITALS — BP 133/85 | HR 74 | Resp 20 | Ht 71.5 in | Wt 227.0 lb

## 2021-01-24 DIAGNOSIS — R0981 Nasal congestion: Secondary | ICD-10-CM

## 2021-01-24 DIAGNOSIS — G8929 Other chronic pain: Secondary | ICD-10-CM

## 2021-01-24 DIAGNOSIS — M25561 Pain in right knee: Secondary | ICD-10-CM | POA: Diagnosis not present

## 2021-01-24 DIAGNOSIS — R2689 Other abnormalities of gait and mobility: Secondary | ICD-10-CM

## 2021-01-24 MED ORDER — MELOXICAM 15 MG PO TABS: 15.0000 mg | ORAL_TABLET | Freq: Every day | ORAL | 1 refills | Status: DC

## 2021-01-24 NOTE — Progress Notes (Signed)
  HPI with pertinent ROS:   CC: right knee pain, balance issues, nasal congestion  HPI: Pleasant 40 year old male presenting today for the following:  Right knee pain-continues to experience right knee pain.  Had previously done physical therapy exercises at home as well as conservative management for several weeks while out of work.  This was extremely helpful in his knee pain almost completely resolved.  Unfortunately he has had to go back to work and is on his feet for long hours per day in addition to the work he has to do at home.  Since then, his knee pain has gradually worsened and is now affecting his ability to perform his job and home responsibilities.  Balance issues-notes that he has always had balance issues and his significant other has commented on how he moves like an old man rather than someone who is only 21.  He is concerned that this may be an inner ear issue.  Nasal congestion-his significant other also has told him that he does not breathe like a normal person.  Notes that he always gets congested overnight while he sleeping and has difficulty clearing his sinuses pursing in the morning.  Wonders if he may have a deviated septum that is contributing to this.  I reviewed the past medical history, family history, social history, surgical history, and allergies today and no changes were needed.  Please see the problem list section below in epic for further details.   Physical exam:   General: Well Developed, well nourished, and in no acute distress.  Neuro: Alert and oriented x3.  HEENT: Normocephalic, atraumatic.  Skin: Warm and dry. Cardiac: Regular rate and rhythm, no murmurs rubs or gallops, no lower extremity edema.  Respiratory: Clear to auscultation bilaterally. Not using accessory muscles, speaking in full sentences.  Impression and Recommendations:    1. Chronic pain of right knee Start meloxicam 15 mg daily x2 weeks then daily as needed.  Resume home physical  therapy exercises 1-2 times per day.  Completing FMLA forms once they are faxed.  Letter provided to patient so he can notify his job that he needs FMLA modifications.  2. Balance problem 3. Nasal congestion Suspect this may be a one issue with a probable deviated septum contributing to nasal congestion which contributes to balance issues.  Referring to ENT for further evaluation. - Ambulatory referral to ENT  Return in about 4 weeks (around 02/21/2021) for knee pain follow up with Dr. Karie Schwalbe. ___________________________________________ Thayer Ohm, DNP, APRN, FNP-BC Primary Care and Sports Medicine [facility] [place]

## 2021-03-18 DIAGNOSIS — S61213A Laceration without foreign body of left middle finger without damage to nail, initial encounter: Secondary | ICD-10-CM | POA: Diagnosis not present

## 2021-03-18 DIAGNOSIS — S61235A Puncture wound without foreign body of left ring finger without damage to nail, initial encounter: Secondary | ICD-10-CM | POA: Diagnosis not present

## 2021-03-18 DIAGNOSIS — Z6830 Body mass index (BMI) 30.0-30.9, adult: Secondary | ICD-10-CM | POA: Diagnosis not present

## 2021-03-18 DIAGNOSIS — Z23 Encounter for immunization: Secondary | ICD-10-CM | POA: Diagnosis not present

## 2021-08-15 DIAGNOSIS — J0100 Acute maxillary sinusitis, unspecified: Secondary | ICD-10-CM | POA: Diagnosis not present

## 2021-08-15 DIAGNOSIS — Z6830 Body mass index (BMI) 30.0-30.9, adult: Secondary | ICD-10-CM | POA: Diagnosis not present

## 2021-08-15 DIAGNOSIS — R051 Acute cough: Secondary | ICD-10-CM | POA: Diagnosis not present

## 2021-08-25 ENCOUNTER — Emergency Department
Admission: EM | Admit: 2021-08-25 | Discharge: 2021-08-25 | Disposition: A | Discharge status: Home / Self Care | Payer: Federal, State, Local not specified - PPO | Source: Home / Self Care | Attending: Family Medicine | Admitting: Family Medicine

## 2021-08-25 ENCOUNTER — Encounter: Payer: Self-pay | Admitting: Emergency Medicine

## 2021-08-25 DIAGNOSIS — J209 Acute bronchitis, unspecified: Secondary | ICD-10-CM

## 2021-08-25 DIAGNOSIS — J3089 Other allergic rhinitis: Secondary | ICD-10-CM | POA: Diagnosis not present

## 2021-08-25 MED ORDER — AMOXICILLIN-POT CLAVULANATE 875-125 MG PO TABS: 1.0000 | ORAL_TABLET | Freq: Two times a day (BID) | ORAL | 0 refills | Status: DC

## 2021-08-25 MED ORDER — PREDNISONE 20 MG PO TABS: ORAL_TABLET | ORAL | 0 refills | Status: DC

## 2021-08-25 NOTE — ED Provider Notes (Signed)
?[place] ? ? ? ?CSN: [id number] ?Arrival date & time: 08/25/21  1022 ? ? ?  ? ?History   ?Chief Complaint ?Chief Complaint  ?Patient presents with  ? Cough  ?  Pt c/o cough, congestion and fatigue for weeks. States he took prednisone and zpak on April 10th and felt a little better but symptoms returned after stopping meds. Adebrile.   ? ? ?HPI ?John Lester is a 41 y.o. male.  ? ?HPI ? ?Patient states he has significant allergies.  He is having and time this year.  He states that every spring he has recurring cough and sinus problems.  He was just seen at an outside clinic on 08/15/2021.  He was given a Z-Pak and a Medrol pack.  He states that this gave him some improvement in his symptoms but they came back as soon as the medicine stopped.  He does not feel like a Z-Pak is "strong enough".  He has continued to cough.  He states he has bad coughing spells where he coughs until he has to sit down and his eyes water.  His chest is sore from the coughing.  He is a non-smoker.  He denies underlying asthma or lung disease. ?He states in spite of the prior treatment he has continued to have pressure and pain in his right cheek and points to his right maxillary area.  He states that he has congestion in the right nostril that is persisting. ?He is taking his Claritin and Flonase daily ? ?Past Medical History:  ?Diagnosis Date  ? Asthma   ? ? ?Patient Active Problem List  ? Diagnosis Date Noted  ? 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 ? ?Past Surgical History:  ?Procedure Laterality Date  ? ADENOIDECTOMY    ? MYRINGOTOMY    ? ? ? ? ? ?Home Medications   ? ?Prior to Admission medications   ?Medication Sig Start Date End Date Taking? Authorizing Provider  ?amoxicillin-clavulanate (AUGMENTIN) 875-125 MG tablet Take 1 tablet by mouth every 12 (twelve) hours. 08/25/21  Yes Raylene Everts, MD  ?predniSONE (DELTASONE) 20 MG tablet Take 2 pills once a day for for 5 days, then take 1 pill  once a day for 5 days, then discontinue.  Take with food 08/25/21  Yes Raylene Everts, MD  ? ? ?Family History ?Family History  ?Problem Relation Age of Onset  ? Hyperlipidemia Father   ? Hypertension Father   ? Hypertension Mother   ? Diabetes Mother   ? Hypertension Maternal Grandmother   ? Hypertension Maternal Grandfather   ? Diabetes Maternal Grandfather   ? Hypertension Paternal Grandmother   ? Hypertension Paternal Grandfather   ? ? ?Social History ?Social History  ? ?Tobacco Use  ? Smoking status: Never  ? Smokeless tobacco: Former  ?  Types: Snuff  ?Vaping Use  ? Vaping Use: Never used  ?Substance Use Topics  ? Alcohol use: Yes  ?  Comment: rarely  ? Drug use: Never  ? ? ? ?Allergies   ?Patient has no known allergies. ? ? ?Review of Systems ?Review of Systems ?See HPI ? ?Physical Exam ?Triage Vital Signs ?ED Triage Vitals  ?Enc Vitals Group  ?   BP 08/25/21 1043 131/87  ?   Pulse Rate 08/25/21 1043 78  ?   Resp 08/25/21 1043 18  ?   Temp 08/25/21 1043 98.3 ?F (36.8 ?C)  ?   Temp Source 08/25/21 1043 Oral  ?  SpO2 08/25/21 1043 97 %  ?   Weight --   ?   Height --   ?   Head Circumference --   ?   Peak Flow --   ?   Pain Score 08/25/21 1045 0  ?   Pain Loc --   ?   Pain Edu? --   ?   Excl. in [place]? --   ? ?No data found. ? ?Updated Vital Signs ?BP 131/87 (BP Location: Right Arm)   Pulse 78   Temp 98.3 ?F (36.8 ?C) (Oral)   Resp 18   SpO2 97%  ?   ? ?Physical Exam ?Constitutional:   ?   General: He is not in acute distress. ?   Appearance: He is well-developed and normal weight. He is ill-appearing.  ?HENT:  ?   Head: Normocephalic and atraumatic.  ?   Right Ear: Tympanic membrane and ear canal normal.  ?   Left Ear: Tympanic membrane and ear canal normal.  ?   Nose: Congestion present. No rhinorrhea.  ?   Mouth/Throat:  ?   Pharynx: Posterior oropharyngeal erythema present.  ?   Comments: Some posterior pharyngeal erythema and lymphoid hyperplasia from postnasal drip.  Nasal membranes are swollen and  erythematous ?Eyes:  ?   Conjunctiva/sclera: Conjunctivae normal.  ?   Pupils: Pupils are equal, round, and reactive to light.  ?Cardiovascular:  ?   Rate and Rhythm: Normal rate and regular rhythm.  ?   Heart sounds: Normal heart sounds.  ?Pulmonary:  ?   Effort: Pulmonary effort is normal. No respiratory distress.  ?   Breath sounds: Normal breath sounds. No wheezing or rhonchi.  ?Abdominal:  ?   General: There is no distension.  ?   Palpations: Abdomen is soft.  ?Musculoskeletal:     ?   General: Normal range of motion.  ?   Cervical back: Normal range of motion.  ?Lymphadenopathy:  ?   Cervical: No cervical adenopathy.  ?Skin: ?   General: Skin is warm and dry.  ?Neurological:  ?   Mental Status: He is alert.  ?Psychiatric:     ?   Mood and Affect: Mood normal.     ?   Behavior: Behavior normal.  ? ? ? ?UC Treatments / Results  ?Labs ?(all labs ordered are listed, but only abnormal results are displayed) ?Labs Reviewed - No data to display ? ?EKG ? ? ?Radiology ?No results found. ? ?Procedures ?Procedures (including critical care time) ? ?Medications Ordered in UC ?Medications - No data to display ? ?Initial Impression / Assessment and Plan / UC Course  ?I have reviewed the triage vital signs and the nursing notes. ? ?Pertinent labs & imaging results that were available during my care of the patient were reviewed by me and considered in my medical decision making (see chart for details). ? ?  ? ?Patient appears to have a chronic sinus congestion in the right maxillary area that is resistant to azithromycin treatment.  We will treat with Augmentin for 10 days.  Also push fluids, Mucinex to loosen the mucus, continue allergy medicine.  Been giving him prednisone to augment his allergy regimen.  See primary care if fails to improve ?Final Clinical Impressions(s) / UC Diagnoses  ? ?Final diagnoses:  ?Environmental and seasonal allergies  ?Acute bronchitis, unspecified organism  ? ? ? ?Discharge Instructions   ? ?   ?Continue to drink lots of fluids ?Continue your Claritin and Flonase ?Add Mucinex  DM (or generic) 2 times a day ?I have prescribed Augmentin antibiotic.  1 pill 2 times a day for 10 days ?I have also prescribed prednisone, take 2 pills a day for 5 days, then 1 pill a day for 5 days, then discontinue ?See your doctor if you fail to improve ? ? ?ED Prescriptions   ? ? Medication Sig Dispense Auth. Provider  ? amoxicillin-clavulanate (AUGMENTIN) 875-125 MG tablet Take 1 tablet by mouth every 12 (twelve) hours. 20 tablet Raylene Everts, MD  ? predniSONE (DELTASONE) 20 MG tablet Take 2 pills once a day for for 5 days, then take 1 pill once a day for 5 days, then discontinue.  Take with food 15 tablet Raylene Everts, MD  ? ?  ? ?PDMP not reviewed this encounter. ?  ?Raylene Everts, MD ?08/25/21 1117 ? ?

## 2021-08-25 NOTE — Discharge Instructions (Signed)
Continue to drink lots of fluids ?Continue your Claritin and Flonase ?Add Mucinex DM (or generic) 2 times a day ?I have prescribed Augmentin antibiotic.  1 pill 2 times a day for 10 days ?I have also prescribed prednisone, take 2 pills a day for 5 days, then 1 pill a day for 5 days, then discontinue ?See your doctor if you fail to improve ?

## 2021-08-25 NOTE — ED Triage Notes (Signed)
Pt c/o cough, congestion and fatigue for weeks. States he took prednisone and zpak on April 10th and felt a little better but symptoms returned after stopping meds. Adebrile.  ?

## 2021-09-01 IMAGING — DX DG KNEE COMPLETE 4+V*R*
4 series · 4 of 4 positions shown · non-contrast
Comparison: 06/06/2019

CLINICAL DATA: Right knee weakness for 1 year, no known injury,
initial encounter

EXAM:
RIGHT KNEE - COMPLETE 4+ VIEW

[knee ap]
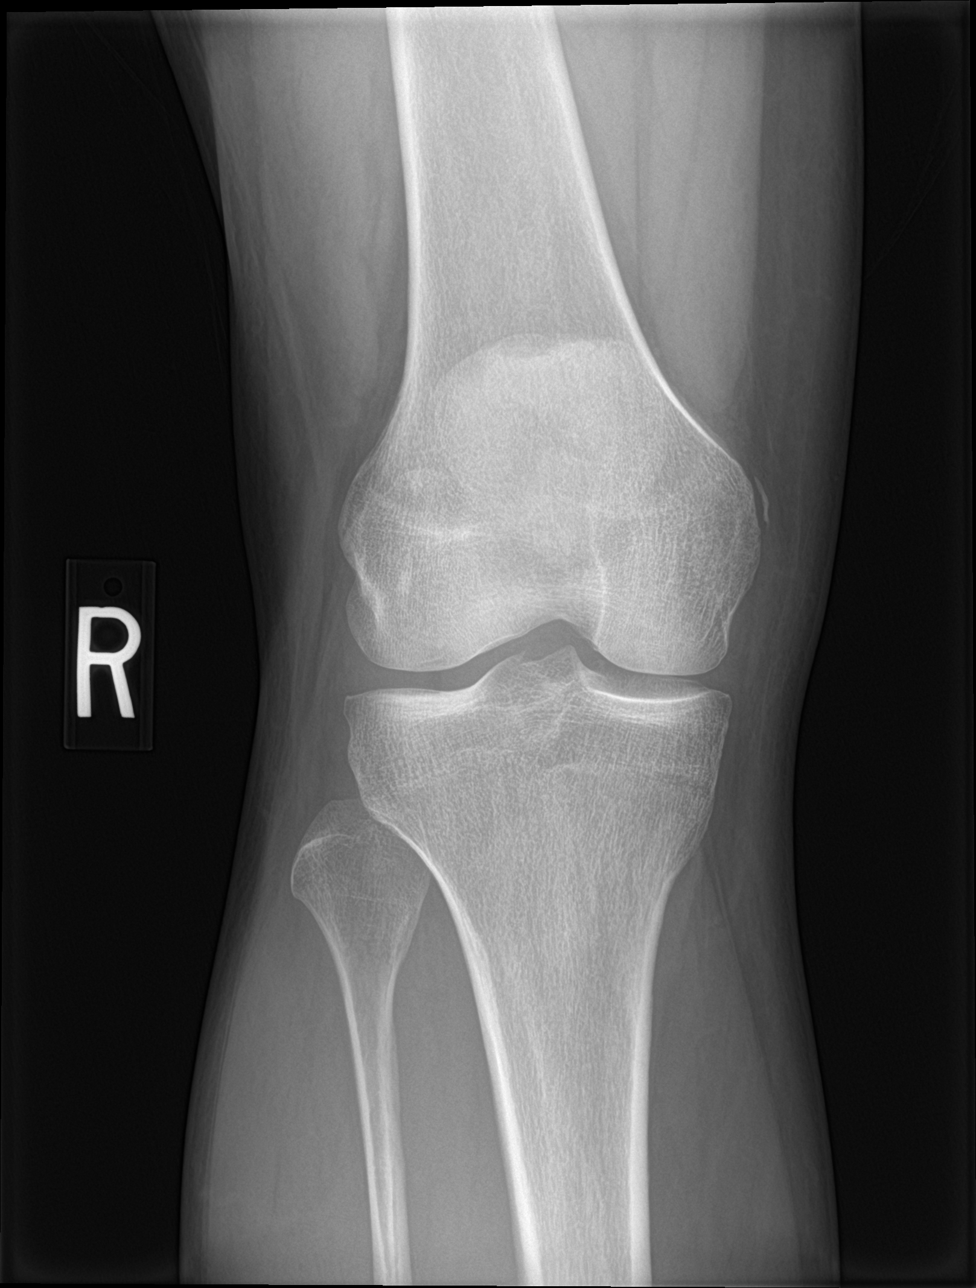

[knee obl (1 of 2)]
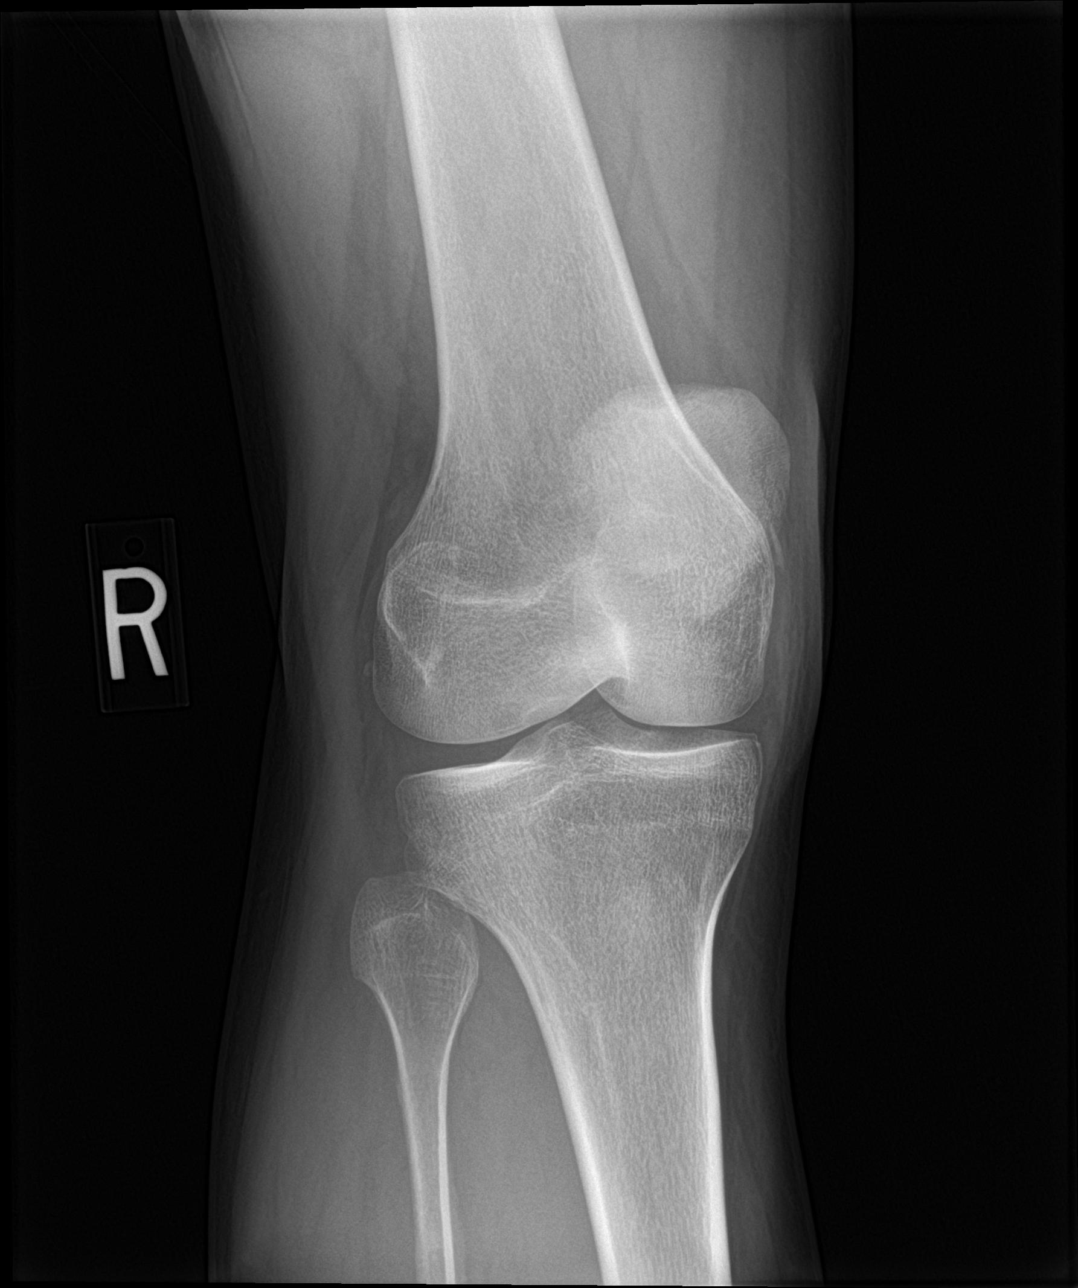

[knee obl (2 of 2)]
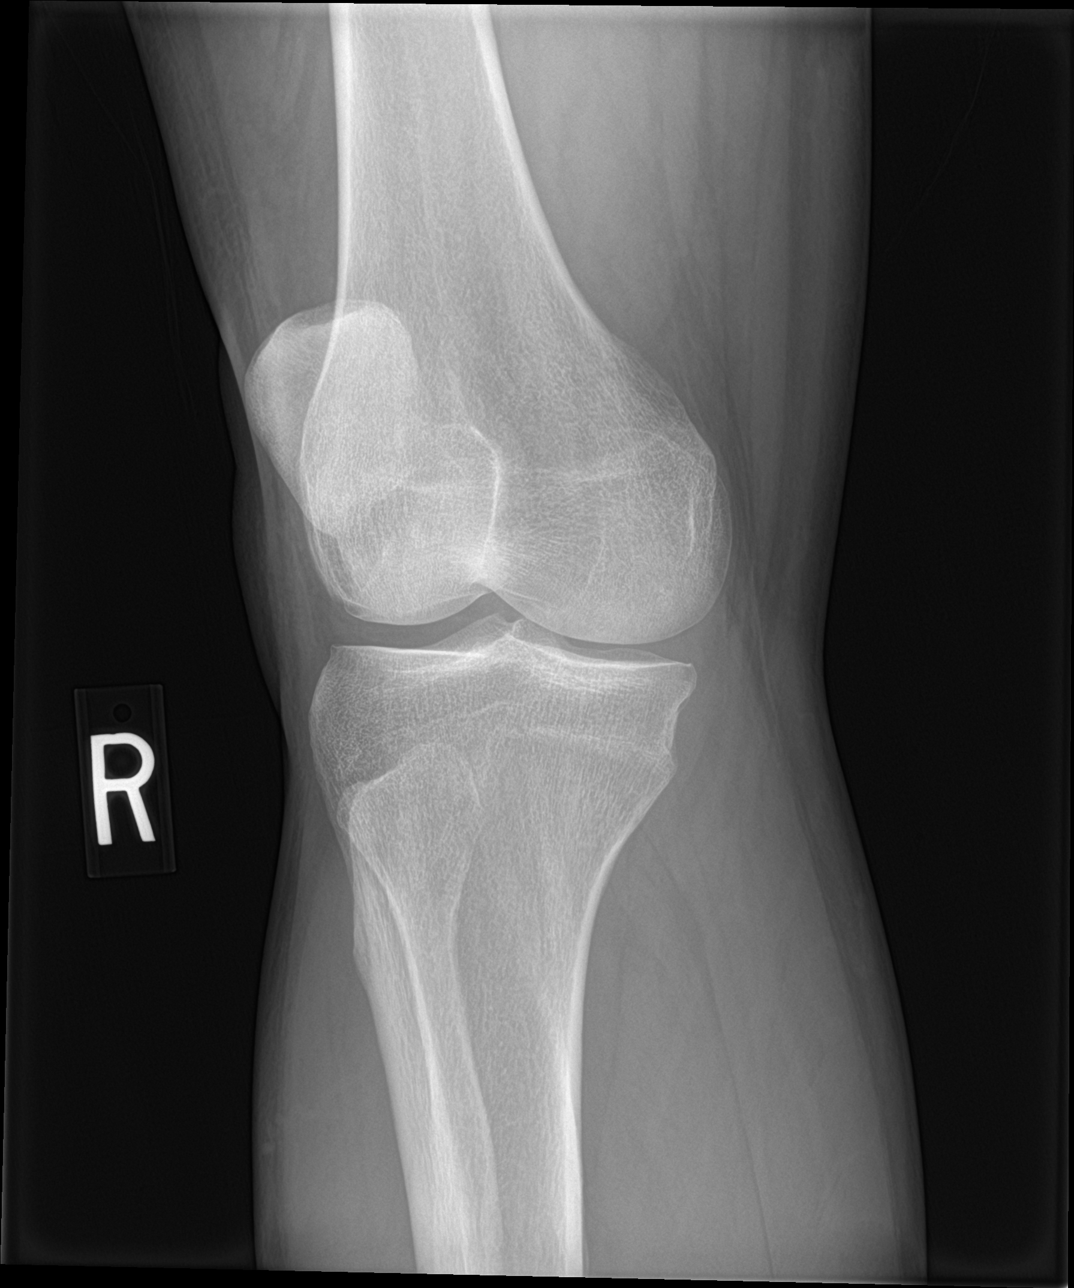

[knee lat]
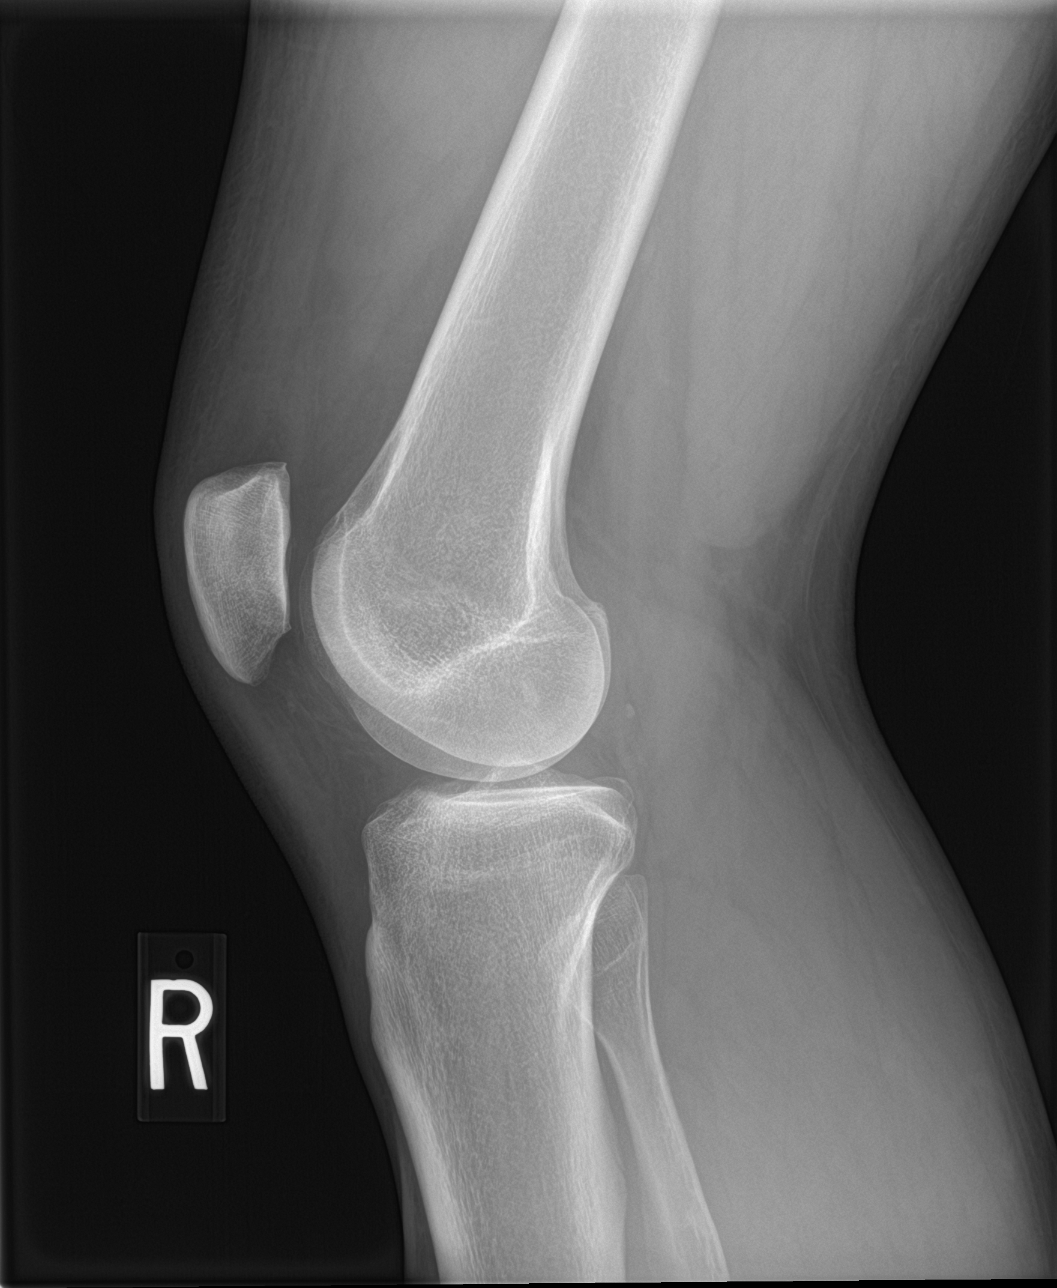

[4 of 4 positions shown; findings below may reference images not displayed]

FINDINGS: Minimal medial joint space narrowing is noted. No joint effusion is
seen. No acute fracture or dislocation is noted.
IMPRESSION: Minimal degenerative change without acute abnormality.

## 2021-12-05 ENCOUNTER — Emergency Department
Admission: EM | Admit: 2021-12-05 | Discharge: 2021-12-05 | Disposition: A | Discharge status: Home / Self Care | Payer: Federal, State, Local not specified - PPO | Attending: Family Medicine | Admitting: Family Medicine

## 2021-12-05 DIAGNOSIS — H10021 Other mucopurulent conjunctivitis, right eye: Secondary | ICD-10-CM

## 2021-12-05 MED ORDER — CIPROFLOXACIN HCL 0.3 % OP SOLN: 1.0000 [drp] | OPHTHALMIC | 0 refills | Status: DC

## 2021-12-05 NOTE — ED Provider Notes (Signed)
[facility]    CSN: [id number] Arrival date & time: 12/05/21  1740      History   Chief Complaint Chief Complaint  Patient presents with   Conjunctivitis    HPI John Lester is a 41 y.o. male.   HPI Patient has pinkeye.  He has had before.  He wears contact lenses.  He has not had any problems for 5 years.  He has daily use contacts.  The redness started today.  He is very photophobic.  Increased tearing.  No other symptoms.  Vision is normal  Patient wears contacts.  He has his contacts and now.  He does not want to take contact out because he states he will be able to drive home.  I told him ideally we should remove the contact and do a fluorescein exam. 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 Authorizing Provider  ciprofloxacin (CILOXAN) 0.3 % ophthalmic solution Place 1 drop into both eyes every 2 (two) hours. Administer 1 drop, every 2 hours, while awake, for 2 days. Then 1 drop, every 4 hours, while awake, for the next 5 days. 12/05/21  Yes Eustace Moore, MD    Family History Family History  Problem Relation Age of Onset   Hyperlipidemia Father    Hypertension Father    Hypertension Mother    Diabetes Mother    Hypertension Maternal Grandmother    Hypertension Maternal Grandfather    Diabetes Maternal Grandfather    Hypertension Paternal Grandmother    Hypertension Paternal Grandfather     Social History Social History   Tobacco Use   Smoking status: Never   Smokeless tobacco: Former    Types: Snuff  Vaping Use   Vaping Use: Never used  Substance Use Topics   Alcohol use: Yes    Comment: rarely   Drug use: Never      Allergies   Patient has no known allergies.   Review of Systems Review of Systems  See HPI Physical Exam Triage Vital Signs ED Triage Vitals  Enc Vitals Group     BP 12/05/21 1757 130/85     Pulse Rate 12/05/21 1757 88     Resp 12/05/21 1757 14     Temp 12/05/21 1757 99.6 F (37.6 C)     Temp Source 12/05/21 1757 Oral     SpO2 12/05/21 1757 97 %     Weight --      Height --      Head Circumference --      Peak Flow --      Pain Score 12/05/21 1756 3     Pain Loc --      Pain Edu? --      Excl. in [place]? --    No data found.  Updated Vital Signs BP 130/85 (BP Location: Right Arm)   Pulse 88   Temp 99.6 F (37.6 C) (Oral)   Resp 14   SpO2 97%      Physical Exam Constitutional:      General: He is not in acute distress.    Appearance:  He is well-developed.  HENT:     Head: Normocephalic and atraumatic.  Eyes:     Conjunctiva/sclera: Conjunctivae normal.     Pupils: Pupils are equal, round, and reactive to light.     Comments: Conjunctival injection right.  No purulence.  Mild photophobia  Cardiovascular:     Rate and Rhythm: Normal rate.  Pulmonary:     Effort: Pulmonary effort is normal. No respiratory distress.  Abdominal:     General: There is no distension.     Palpations: Abdomen is soft.  Musculoskeletal:        General: Normal range of motion.     Cervical back: Normal range of motion.  Skin:    General: Skin is warm and dry.  Neurological:     Mental Status: He is alert.  Psychiatric:        Mood and Affect: Mood normal.        Behavior: Behavior normal.      UC Treatments / Results  Labs (all labs ordered are listed, but only abnormal results are displayed) Labs Reviewed - No data to display  EKG   Radiology No results found.  Procedures Procedures (including critical care time)  Medications Ordered in UC Medications - No data to display  Initial Impression / Assessment and Plan / UC Course  I have reviewed the triage  vital signs and the nursing notes.  Pertinent labs & imaging results that were available during my care of the patient were reviewed by me and considered in my medical decision making (see chart for details).     Final Clinical Impressions(s) / UC Diagnoses   Final diagnoses:  Pink eye disease of right eye     Discharge Instructions      Take out contact lens until your eyes have cleared up +48 hours Use eyedrops as directed May use liquid tears in addition if needed Cool compresses help Follow-up with your eye doctor for any problems or failure to resolve    ED Prescriptions     Medication Sig Dispense Auth. Provider   ciprofloxacin (CILOXAN) 0.3 % ophthalmic solution Place 1 drop into both eyes every 2 (two) hours. Administer 1 drop, every 2 hours, while awake, for 2 days. Then 1 drop, every 4 hours, while awake, for the next 5 days. 5 mL Eustace Moore, MD      PDMP not reviewed this encounter.   Eustace Moore, MD 12/05/21 [phone]

## 2021-12-05 NOTE — ED Triage Notes (Signed)
Pt presents with rt eye redness and light sensitivity that began this morning.

## 2021-12-05 NOTE — Discharge Instructions (Signed)
Take out contact lens until your eyes have cleared up +48 hours Use eyedrops as directed May use liquid tears in addition if needed Cool compresses help Follow-up with your eye doctor for any problems or failure to resolve

## 2022-03-10 ENCOUNTER — Ambulatory Visit: Payer: Federal, State, Local not specified - PPO | Admitting: Medical-Surgical

## 2022-03-10 ENCOUNTER — Encounter: Payer: Self-pay | Admitting: Medical-Surgical

## 2022-03-10 VITALS — BP 136/78 | HR 80 | Ht 71.5 in | Wt 235.0 lb

## 2022-03-10 DIAGNOSIS — E669 Obesity, unspecified: Secondary | ICD-10-CM

## 2022-03-10 DIAGNOSIS — Z0000 Encounter for general adult medical examination without abnormal findings: Secondary | ICD-10-CM

## 2022-03-10 DIAGNOSIS — G5603 Carpal tunnel syndrome, bilateral upper limbs: Secondary | ICD-10-CM | POA: Diagnosis not present

## 2022-03-10 DIAGNOSIS — T781XXA Other adverse food reactions, not elsewhere classified, initial encounter: Secondary | ICD-10-CM

## 2022-03-10 DIAGNOSIS — J342 Deviated nasal septum: Secondary | ICD-10-CM

## 2022-03-10 DIAGNOSIS — W57XXXS Bitten or stung by nonvenomous insect and other nonvenomous arthropods, sequela: Secondary | ICD-10-CM | POA: Diagnosis not present

## 2022-03-10 DIAGNOSIS — R079 Chest pain, unspecified: Secondary | ICD-10-CM

## 2022-03-10 DIAGNOSIS — F419 Anxiety disorder, unspecified: Secondary | ICD-10-CM

## 2022-03-10 MED ORDER — EPINEPHRINE 0.3 MG/0.3ML IJ SOAJ: 0.3000 mg | INTRAMUSCULAR | 2 refills | Status: AC | PRN

## 2022-03-10 MED ORDER — MELOXICAM 15 MG PO TABS: 15.0000 mg | ORAL_TABLET | Freq: Every day | ORAL | 2 refills | Status: AC

## 2022-03-10 NOTE — Progress Notes (Unsigned)
Established Patient Office Visit  Subjective   Patient ID: John Lester, male   DOB: 03-01-1981 Age: 41 y.o. MRN: [id number]   Chief Complaint  Patient presents with   Annual Exam    HPI Pleasant 41 year old male presenting today for the following:  Bilateral hand numbness: reports that he has been experiencing bilateral hand numbness and tingling in the middle and ring fingers on a daily basis. 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 The numbness has been happening off and on for years but lately it has worsened and is now affecting him every day. He has begun having some discomfort in the wrists and the back of his hands and is worried that arthritis has now set in.   Reports that he has several other healthy issues that are not listed in his medical record including a deviated septum, anxiety ("borderline insane" per patient statement), multiple tick bites, and GI issues surround intake of beef from certain sources. Is interested in getting tested for Alpha gal as he notes that he has diarrhea, bloating, and abdominal pain if he eats beef that is not fresh (such as frozen beef patties).     Objective:    Vitals:   03/10/22 1500  BP: 136/78  Pulse: 80  Height: 5' 11.5" (1.816 m)  Weight: 235 lb (106.6 kg)  SpO2: 98%  BMI (Calculated): 32.32    Physical Exam Vitals reviewed.  Constitutional:      General: He is not in acute distress.    Appearance: Normal appearance. He is obese. He is not ill-appearing.  HENT:     Head: Normocephalic and atraumatic.  Cardiovascular:     Rate and Rhythm: Normal rate and regular rhythm.     Pulses: Normal pulses.     Heart sounds: Normal heart sounds. No murmur heard.    No friction rub. No gallop.  Pulmonary:     Effort: Pulmonary effort is normal. No respiratory distress.     Breath sounds: Normal breath sounds.  Skin:     General: Skin is warm and dry.  Neurological:     Mental Status: He is alert and oriented to person, place, and time.  Psychiatric:        Mood and Affect: Mood normal.        Behavior: Behavior normal.        Thought Content: Thought content normal.        Judgment: Judgment normal.   No results found for this or any previous visit (from the past 24 hour(s)).     The 10-year ASCVD risk score (Arnett DK, et al., 2019) is: 1.2%   Values used to calculate the score:     Age: 73 years     Sex: Male     Is Non-Hispanic African American: No     Diabetic: No     Tobacco smoker: No     Systolic Blood Pressure: 536 mmHg     Is BP treated: No     HDL Cholesterol: 43 mg/dL     Total Cholesterol: 152 mg/dL   Assessment & Plan:   1. Bilateral carpal tunnel syndrome Symptoms progressive and exacerbated by activities that require lifting and manual dexterity. Recommend conservative measures including Meloxicam 15mg  daily, home exercises (printed and given to patient), and bilateral nighttime bracing for 6 weeks. Work note provided to limit lifting  to no greater than 10lbs for the next 6 weeks. If further forms needed, advised to have them faxed to [place].If no improvement in symptoms, consider hydrodisection with Dr. Karie Schwalbe.   2. Deviated septum Added to history.   3. Anxiety Discussed options with patient. Referring to psychiatry per his request.  - Ambulatory referral to Psychiatry  4. Tick bite, unspecified site, sequela Testing for Alpha-gal.  - Alpha-Gal Panel  5. Preventative health care Checking labs as below.  - Lipid panel - COMPLETE METABOLIC PANEL WITH GFR - CBC with Differential/Platelet  6. Class 1 obesity Checking labs as below. Recommend dietary changes and regular intentional exercise with a goal for weight loss.  - Hemoglobin A1c - TSH  Return in about 6 weeks (around 04/21/2022) for carpal tunnel follow up.  ___________________________________________ Thayer Ohm,  DNP, APRN, FNP-BC Primary Care and Sports Medicine [facility] [place]

## 2022-03-13 LAB — COMPLETE METABOLIC PANEL WITH GFR
AG Ratio: 1.4 (calc) (ref 1.0–2.5)
ALT: 50 U/L — ABNORMAL HIGH (ref 9–46)
AST: 24 U/L (ref 10–40)
Albumin: 4.2 g/dL (ref 3.6–5.1)
Alkaline phosphatase (APISO): 70 U/L (ref 36–130)
BUN: 11 mg/dL (ref 7–25)
CO2: 25 mmol/L (ref 20–32)
Calcium: 9.5 mg/dL (ref 8.6–10.3)
Chloride: 105 mmol/L (ref 98–110)
Creat: 1.02 mg/dL (ref 0.60–1.29)
Globulin: 3.1 g/dL (calc) (ref 1.9–3.7)
Glucose, Bld: 90 mg/dL (ref 65–99)
Potassium: 4.1 mmol/L (ref 3.5–5.3)
Sodium: 139 mmol/L (ref 135–146)
Total Bilirubin: 0.4 mg/dL (ref 0.2–1.2)
Total Protein: 7.3 g/dL (ref 6.1–8.1)
eGFR: 94 mL/min/{1.73_m2} (ref >=60)

## 2022-03-13 LAB — LIPID PANEL
Cholesterol: 152 mg/dL (ref <200)
HDL: 43 mg/dL (ref >=40)
LDL Cholesterol (Calc): 63 mg/dL (calc)
Non-HDL Cholesterol (Calc): 109 mg/dL (calc) (ref <130)
Total CHOL/HDL Ratio: 3.5 (calc) (ref <5.0)
Triglycerides: 384 mg/dL — ABNORMAL HIGH (ref <150)

## 2022-03-13 LAB — CBC WITH DIFFERENTIAL/PLATELET
Absolute Monocytes: 631 cells/uL (ref 200–950)
Basophils Absolute: 50 cells/uL (ref 0–200)
Basophils Relative: 0.6 %
Eosinophils Absolute: 83 cells/uL (ref 15–500)
Eosinophils Relative: 1 %
HCT: 43.9 % (ref 38.5–50.0)
Hemoglobin: 15.3 g/dL (ref 13.2–17.1)
Lymphs Abs: 2025 cells/uL (ref 850–3900)
MCH: 30.1 pg (ref 27.0–33.0)
MCHC: 34.9 g/dL (ref 32.0–36.0)
MCV: 86.4 fL (ref 80.0–100.0)
MPV: 12 fL (ref 7.5–12.5)
Monocytes Relative: 7.6 %
Neutro Abs: 5511 cells/uL (ref 1500–7800)
Neutrophils Relative %: 66.4 %
Platelets: 278 10*3/uL (ref 140–400)
RBC: 5.08 10*6/uL (ref 4.20–5.80)
RDW: 13.1 % (ref 11.0–15.0)
Total Lymphocyte: 24.4 %
WBC: 8.3 10*3/uL (ref 3.8–10.8)

## 2022-03-13 LAB — ALPHA-GAL PANEL
Allergen, Mutton, f88: 14.1 kU/L — ABNORMAL HIGH
Allergen, Pork, f26: 8.19 kU/L — ABNORMAL HIGH
Beef: 18.9 kU/L — ABNORMAL HIGH
CLASS: 3
CLASS: 4
Class: 3
GALACTOSE-ALPHA-1,3-GALACTOSE IGE*: >100 kU/L — ABNORMAL HIGH (ref <0.10)

## 2022-03-13 LAB — HEMOGLOBIN A1C
Hgb A1c MFr Bld: 6.1 % of total Hgb — ABNORMAL HIGH (ref <5.7)
Mean Plasma Glucose: 128 mg/dL
eAG (mmol/L): 7.1 mmol/L

## 2022-03-13 LAB — INTERPRETATION:

## 2022-03-13 LAB — TSH: TSH: 2.23 mIU/L (ref 0.40–4.50)

## 2022-03-14 NOTE — Addendum Note (Signed)
Addended bySamuel Bouche on: 03/14/2022 04:06 PM   Modules accepted: Orders

## 2022-03-21 ENCOUNTER — Telehealth: Payer: Self-pay | Admitting: Medical-Surgical

## 2022-03-21 NOTE — Telephone Encounter (Signed)
Patient came in and dropped off FMLA paperwork will put in your box to be completed.

## 2022-03-22 NOTE — Telephone Encounter (Signed)
Copy made for scan and for our records.  Faxed forms to: [phone]  Contacted patient to advise that the forms are completed and ready for pick up at his convenience.

## 2022-04-03 NOTE — Progress Notes (Unsigned)
New Patient Note  RE: John Lester MRN: [id number] DOB: October 25, 1980 Date of Office Visit: 04/04/2022  Consult requested by: Christen Butter, NP Primary care provider: Christen Butter, NP  Chief Complaint: Sinusitis and Allergies (Alpha Gal)  History of Present Illness: I had the pleasure of seeing John Lester for initial evaluation at the [facility] on 04/04/2022. He is a 41 y.o. male, who is referred here by Christen Butter, NP for the evaluation of alpha gal allergy.  Patient recently diagnosed with alpha gal allergy.  He can 1 fresh ground burger from a particular restaurants with no issues but if he has a frozen burger then he has issues. He usually has abdominal pains and diarrhea about 5-6 hours after ingestion. He also breaks out in hives and has throat swelling.   Symptoms usually resolve within 1 day and usually takes benadryl for this with good benefit.   This started to happen about 4 years ago in the fall. He had many tick bites that summer.  He was evaluated in ED the first time. Since this episode, he does report other accidental exposures to red meat. He had pork recently and had mainly GI symptoms. He does have access to epinephrine autoinjector and not needed to use it.   Past work up includes: 03/10/2022 - positive to alpha gal.  Dietary History: patient has been eating other foods including milk, eggs, peanut, treenuts, sesame, shellfish, fish, soy, wheat, meats, fruits and vegetables. No issues with dairy or gelatin products.   He reports reading labels and avoiding red meat in diet completely.   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   03/10/2022 PCP visit: "Reports that he has several other healthy issues that are not listed in his medical record including a deviated septum, anxiety ("borderline insane" per patient statement), multiple tick bites, and GI issues surround intake of beef from certain sources. Is  interested in getting tested for Alpha gal as he notes that he has diarrhea, bloating, and abdominal pain if he eats beef that is not fresh (such as frozen beef patties)."  Component     Latest Ref Rng 03/10/2022  Beef     kU/L 18.90 (H)   Class 4   Class 3   Allergen, Mutton, f88     kU/L 14.10 (H)   Class 3   Allergen, Pork, f26     kU/L 8.19 (H)   GALACTOSE-ALPHA-1,3-GALACTOSE IGE*     <0.10 kU/L >100 (H)    Assessment and Plan: John Lester is a 41 y.o. male with: Allergy to alpha-gal Noted diarrhea and abdominal pain 5-6 hours after certain burger ingestion. Also had hives and throat swelling at times. This started 4 years ago. No issues with dairy/gelating products. 03/10/2022 alpha gal IgE level over 100. Continue strict avoidance of mammalian meat. Avoid tick bites. For mild symptoms you can take over the counter antihistamines such as Benadryl and monitor symptoms closely. If symptoms worsen or if you have severe symptoms including breathing issues, throat closure, significant swelling, whole body hives, severe diarrhea and vomiting, lightheadedness then inject epinephrine and seek immediate medical care afterwards. Action plan given. Will recheck in 6-12 months. Gave handout on alpha gal allergy.   Other allergic rhinitis Rhino conjunctivitis symptoms mainly in the spring and fall. Takes OTC antihistamines and Flonase with good benefit. No prior allergy evaluation. 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 Return for skin  testing - no allergy medication for 3 days before. Patient did not have time for skin testing today.  Use over the counter antihistamines such as Zyrtec (cetirizine), Claritin (loratadine), Allegra (fexofenadine), or Xyzal (levocetirizine) daily as needed. May take twice a day during allergy flares. May switch antihistamines every few months. Start Ryaltris (olopatadine + mometasone nasal spray combination) 1-2 sprays per nostril twice a day as needed.  Sample given. This replaces your other nasal sprays. If this works well for you, then have [facility] ship the medication to your home - prescription already sent in.  Nasal saline spray (i.e., Simply Saline) or nasal saline lavage (i.e., NeilMed) is recommended as needed and prior to medicated nasal sprays. Discussed allergy immunotherapy briefly.  Mild intermittent asthma without complication Has to use albuterol for 3-4 weeks in the spring with good benefit. May use albuterol rescue inhaler 2 puffs every 4 to 6 hours as needed for shortness of breath, chest tightness, coughing, and wheezing. Monitor frequency of use.  In the spring:  Start using Breo 1 puff once a day and rinse mouth after each use. Demonstrated proper use.   Return for Skin testing.  Meds ordered this encounter  Medications   Olopatadine-Mometasone (RYALTRIS) 665-25 MCG/ACT SUSP    Sig: Place 1-2 sprays into the nose in the morning and at bedtime.    Dispense:  29 g    Refill:  5    [phone]   albuterol (VENTOLIN HFA) 108 (90 Base) MCG/ACT inhaler    Sig: Inhale 2 puffs into the lungs every 4 (four) hours as needed for wheezing or shortness of breath (coughing fits).    Dispense:  18 g    Refill:  1   fluticasone furoate-vilanterol (BREO ELLIPTA) 100-25 MCG/ACT AEPB    Sig: Inhale 1 puff into the lungs daily. Rinse mouth after each use. Use daily during the spring.    Dispense:  60 each    Refill:  2   Lab Orders  No laboratory test(s) ordered today    Other allergy screening: Asthma: yes Uses albuterol in the spring for shortness of breath with good benefit. Takes albuterol daily for 3-4 weeks in the spring.   Rhino conjunctivitis: yes He reports symptoms of itchy eyes, rhinorrhea, nasal congestion. Symptoms have been going on for many years. The symptoms are present mainly in the spring and fall. Anosmia: no. Headache: yes. He has used benadryl, Claritin, Xyzal, Flonase with fair improvement in  symptoms. Sinus infections: sometimes. Previous work up includes: none. Previous ENT evaluation: not recently.  Previous sinus imaging: no. History of nasal polyps: no. Last eye exam: 10 months ago. History of reflux: sometimes, takes tums prn.  Medication allergy: no Hymenoptera allergy: no Urticaria: no Eczema:no History of recurrent infections suggestive of immunodeficency: no Frequent ear infections as a child.   Diagnostics: Spirometry:  Tracings reviewed. His effort: Good reproducible efforts. FVC: 3.80L FEV1: 3.04L, 65% predicted FEV1/FVC ratio: 80% Interpretation: Spirometry consistent with possible restrictive disease.  Please see scanned spirometry results for details.  Skin Testing:  deferred as patient has to go to another appointment .  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 18 g 1   EPINEPHrine (EPIPEN 2-PAK) 0.3 mg/0.3 mL IJ SOAJ injection Inject 0.3 mg into the muscle as needed for anaphylaxis. 1 each 2   fluticasone furoate-vilanterol (BREO ELLIPTA) 100-25 MCG/ACT AEPB Inhale 1 puff into the lungs daily. Rinse mouth after each use. Use daily during the spring. 60 each 2   levocetirizine (XYZAL) 5 MG tablet Take 5 mg by mouth every evening.     meloxicam (MOBIC) 15 MG tablet Take 1 tablet (15 mg total) by mouth daily. 30  tablet 2   Olopatadine-Mometasone (RYALTRIS) 665-25 MCG/ACT SUSP Place 1-2 sprays into the nose in the morning and at bedtime. 29 g 5   No current facility-administered medications for this visit.   Allergies: Allergies  Allergen Reactions   Alpha-Gal    Social History: Social History   Socioeconomic History   Marital status: Single    Spouse name: Not on file   Number of children: Not on file   Years of education: Not on file   Highest education level: Not on file  Occupational History   Not on file  Tobacco Use   Smoking status: Never    Passive exposure: Never   Smokeless tobacco: Former    Types: Snuff  Vaping Use   Vaping Use: Never used  Substance and Sexual Activity   Alcohol use: Yes    Comment: rarely   Drug use: Never   Sexual activity: Yes    Partners: Female    Birth control/protection: Condom  Other Topics Concern   Not on file  Social History Narrative   Not on file   Social Determinants of Health   Financial Resource Strain: Not on file  Food Insecurity: Not on file  Transportation Needs: Not on file  Physical Activity: Not on file  Stress: Not on file  Social Connections: Not on file   Lives in a 42 year old house. Smoking: denies Occupation: Systems analyst History: Immunologist in the house: no Engineer, civil (consulting) in the family room: yes Carpet in the bedroom: yes Heating: electric Cooling: central Pet: yes outdoor 3 dogs and cows  Family History: Family History  Problem Relation Age of Onset   Allergic rhinitis Mother    Hypertension Mother    Diabetes Mother    Asthma Father    Hyperlipidemia Father    Hypertension Father    Bronchitis Father    Hypertension Maternal Grandmother    Hypertension Maternal Grandfather    Diabetes Maternal Grandfather    Hypertension Paternal Grandmother    Hypertension Paternal Grandfather    Immunodeficiency Neg Hx    Urticaria Neg Hx    Eczema Neg Hx    Angioedema Neg  Hx    Atopy Neg Hx    Review of Systems  Constitutional:  Negative for appetite change, chills, fever and unexpected weight change.  HENT:  Negative for congestion and rhinorrhea.   Eyes:  Negative for itching.  Respiratory:  Negative for cough, chest tightness, shortness of breath and wheezing.   Cardiovascular:  Negative for chest pain.  Gastrointestinal:  Negative for abdominal pain.  Genitourinary:  Negative for difficulty urinating.  Skin:  Negative for rash.  Allergic/Immunologic: Positive for food allergies.  Neurological:  Negative for headaches.    Objective: BP 124/72   Pulse 86   Temp (!) 97.4 F (36.3 C) (Temporal)   Resp 20   Ht 5' 11.5" (1.816 m)   Wt 235 lb (106.6 kg)   SpO2 96%   BMI 32.32 kg/m  Body mass index is 32.32 kg/m. Physical Exam Vitals and nursing note reviewed.  Constitutional:      Appearance: Normal appearance. He is well-developed.  HENT:     Head: Normocephalic and atraumatic.     Right Ear: External ear normal.     Left Ear: Tympanic membrane and external ear normal.     Ears:     Comments: Scarring on right TM    Nose: Nose normal.     Mouth/Throat:     Mouth: Mucous membranes are moist.     Pharynx: Oropharynx is clear.  Eyes:     Conjunctiva/sclera: Conjunctivae normal.  Cardiovascular:     Rate and Rhythm: Normal rate and regular rhythm.     Heart sounds: Normal heart sounds. No murmur heard.    No friction rub. No gallop.  Pulmonary:     Effort: Pulmonary effort is normal.     Breath sounds: Normal breath sounds. No wheezing, rhonchi or rales.  Musculoskeletal:     Cervical back: Neck supple.  Skin:    General: Skin is warm.     Findings: No rash.  Neurological:     Mental Status: He is alert and oriented to person, place, and time.  Psychiatric:        Behavior: Behavior normal.    The plan was reviewed with the patient/family, and all questions/concerned were addressed.  It was my pleasure to see Ivin BootyJoshua today and  participate in his care. Please feel free to contact me with any questions or concerns.  Sincerely,  Wyline MoodYoon Britaney Espaillat, DO Allergy & Immunology  [facility] [place] office: [phone] [facility] [place] office: [phone]

## 2022-04-04 ENCOUNTER — Encounter: Payer: Self-pay | Admitting: Allergy

## 2022-04-04 ENCOUNTER — Ambulatory Visit: Payer: Federal, State, Local not specified - PPO | Admitting: Allergy

## 2022-04-04 ENCOUNTER — Telehealth: Payer: Self-pay

## 2022-04-04 VITALS — BP 124/72 | HR 86 | Temp 97.4°F | Resp 20 | Ht 71.5 in | Wt 235.0 lb

## 2022-04-04 DIAGNOSIS — J3089 Other allergic rhinitis: Secondary | ICD-10-CM | POA: Diagnosis not present

## 2022-04-04 DIAGNOSIS — J4520 Mild intermittent asthma, uncomplicated: Secondary | ICD-10-CM | POA: Diagnosis not present

## 2022-04-04 DIAGNOSIS — Z91018 Allergy to other foods: Secondary | ICD-10-CM | POA: Diagnosis not present

## 2022-04-04 MED ORDER — FLUTICASONE FUROATE-VILANTEROL 100-25 MCG/ACT IN AEPB: 1.0000 | INHALATION_SPRAY | Freq: Every day | RESPIRATORY_TRACT | 2 refills | Status: AC

## 2022-04-04 MED ORDER — RYALTRIS 665-25 MCG/ACT NA SUSP: 1.0000 | Freq: Two times a day (BID) | NASAL | 5 refills | Status: AC

## 2022-04-04 MED ORDER — ALBUTEROL SULFATE HFA 108 (90 BASE) MCG/ACT IN AERS: 2.0000 | INHALATION_SPRAY | RESPIRATORY_TRACT | 1 refills | Status: DC | PRN

## 2022-04-04 NOTE — Assessment & Plan Note (Signed)
Noted diarrhea and abdominal pain 5-6 hours after certain burger ingestion. Also had hives and throat swelling at times. This started 4 years ago. No issues with dairy/gelating products. 03/10/2022 alpha gal IgE level over 100. Continue strict avoidance of mammalian meat. Avoid tick bites. For mild symptoms you can take over the counter antihistamines such as Benadryl and monitor symptoms closely. If symptoms worsen or if you have severe symptoms including breathing issues, throat closure, significant swelling, whole body hives, severe diarrhea and vomiting, lightheadedness then inject epinephrine and seek immediate medical care afterwards. Action plan given. Will recheck in 6-12 months. Gave handout on alpha gal allergy.

## 2022-04-04 NOTE — Telephone Encounter (Signed)
Patient came into office to drop off his FMLA paperwork, patient highlighted places that his employer needed to be completed, forms placed in Joy's box, please advise, thanks!!

## 2022-04-04 NOTE — Assessment & Plan Note (Signed)
Has to use albuterol for 3-4 weeks in the spring with good benefit. May use albuterol rescue inhaler 2 puffs every 4 to 6 hours as needed for shortness of breath, chest tightness, coughing, and wheezing. Monitor frequency of use.  In the spring:  Start using Breo 1 puff once a day and rinse mouth after each use. Demonstrated proper use.

## 2022-04-04 NOTE — Telephone Encounter (Signed)
John Lester is out of the office till December 5th.

## 2022-04-04 NOTE — Patient Instructions (Addendum)
Food allergy Continue strict avoidance of mammalian meat. Avoid tick bites. For mild symptoms you can take over the counter antihistamines such as Benadryl and monitor symptoms closely. If symptoms worsen or if you have severe symptoms including breathing issues, throat closure, significant swelling, whole body hives, severe diarrhea and vomiting, lightheadedness then inject epinephrine and seek immediate medical care afterwards. Action plan given.  Breathing May use albuterol rescue inhaler 2 puffs every 4 to 6 hours as needed for shortness of breath, chest tightness, coughing, and wheezing. Monitor frequency of use.  In the spring:  Start using Breo 1 puff once a day and rinse mouth after each use.   Rhinitis Return for skin testing - no allergy medication for 3 days before. Use over the counter antihistamines such as Zyrtec (cetirizine), Claritin (loratadine), Allegra (fexofenadine), or Xyzal (levocetirizine) daily as needed. May take twice a day during allergy flares. May switch antihistamines every few months. Start Ryaltris (olopatadine + mometasone nasal spray combination) 1-2 sprays per nostril twice a day as needed. Sample given. This replaces your other nasal sprays. If this works well for you, then have [facility] ship the medication to your home - prescription already sent in.  Nasal saline spray (i.e., Simply Saline) or nasal saline lavage (i.e., NeilMed) is recommended as needed and prior to medicated nasal sprays.  Follow up for skin testing.

## 2022-04-04 NOTE — Assessment & Plan Note (Addendum)
Rhino conjunctivitis symptoms mainly in the spring and fall. Takes OTC antihistamines and Flonase with good benefit. No prior allergy evaluation. 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 Return for skin testing - no allergy medication for 3 days before. Patient did not have time for skin testing today.  Use over the counter antihistamines such as Zyrtec (cetirizine), Claritin (loratadine), Allegra (fexofenadine), or Xyzal (levocetirizine) daily as needed. May take twice a day during allergy flares. May switch antihistamines every few months. Start Ryaltris (olopatadine + mometasone nasal spray combination) 1-2 sprays per nostril twice a day as needed. Sample given. This replaces your other nasal sprays. If this works well for you, then have [facility] ship the medication to your home - prescription already sent in.  Nasal saline spray (i.e., Simply Saline) or nasal saline lavage (i.e., NeilMed) is recommended as needed and prior to medicated nasal sprays. Discussed allergy immunotherapy briefly.

## 2022-04-12 NOTE — Telephone Encounter (Signed)
Made copy for scan and for our records and placed the original at the front. Unable to leave message for patient. Voicemail box not set up yet.

## 2022-04-21 ENCOUNTER — Ambulatory Visit: Payer: Federal, State, Local not specified - PPO | Admitting: Medical-Surgical

## 2022-04-21 ENCOUNTER — Encounter: Payer: Self-pay | Admitting: Medical-Surgical

## 2022-04-21 VITALS — BP 131/79 | HR 95 | Resp 20 | Ht 71.5 in | Wt 239.6 lb

## 2022-04-21 DIAGNOSIS — G5603 Carpal tunnel syndrome, bilateral upper limbs: Secondary | ICD-10-CM

## 2022-04-21 NOTE — Progress Notes (Signed)
   Established Patient Office Visit  Subjective   Patient ID: John Lester, male   DOB: 06/10/1980 Age: 41 y.o. MRN: [id number]   Chief Complaint  Patient presents with   Carpal Tunnel   Follow-up    HPI Pleasant 40 year old male presenting today for follow-up on bilateral carpal tunnel syndrome.  Reports that he has been doing well overall and that his carpal tunnel symptoms have greatly improved.  Feels that he is ready to go back to work on Monday and does not desire any work accommodations or job restrictions.  Notes that he has had difficulty with HR and the approval of his FMLA.  They are reporting that it has been a closed case and was denied because they never received the updated papers.  These were faxed from our office with confirmation received however he would like [place] to fax them again.   Objective:    Vitals:   04/21/22 1014  BP: 131/79  Pulse: 95  Resp: 20  Height: 5' 11.5" (1.816 m)  Weight: 239 lb 9.6 oz (108.7 kg)  SpO2: 99%  BMI (Calculated): 32.96    Physical Exam Vitals reviewed.  Constitutional:      General: He is not in acute distress.    Appearance: Normal appearance. He is obese. He is not ill-appearing.  HENT:     Head: Normocephalic.  Cardiovascular:     Rate and Rhythm: Normal rate.     Pulses: Normal pulses.     Heart sounds: Normal heart sounds. No murmur heard.    No friction rub. No gallop.  Pulmonary:     Effort: Pulmonary effort is normal. No respiratory distress.     Breath sounds: Normal breath sounds.  Skin:    General: Skin is warm and dry.  Neurological:     Mental Status: He is alert and oriented to person, place, and time.  Psychiatric:        Mood and Affect: Mood normal.        Behavior: Behavior normal.        Thought Content: Thought content normal.        Judgment: Judgment normal.   No results found for this or any previous visit (from the past 24 hour(s)).     The 10-year ASCVD risk score (Arnett DK, et al.,  2019) is: 1.1%   Values used to calculate the score:     Age: 63 years     Sex: Male     Is Non-Hispanic African American: No     Diabetic: No     Tobacco smoker: No     Systolic Blood Pressure: 131 mmHg     Is BP treated: No     HDL Cholesterol: 43 mg/dL     Total Cholesterol: 152 mg/dL   Assessment & Plan:   1. Bilateral carpal tunnel syndrome Symptoms greatly improved with conservative measures.  He feels that he is ready to go back to work so a note provided today to release him back to work on 04/24/2022 without restriction.  Advised to continue conservative measures for management of carpal tunnel at home.  If it begins to worsen again, return to our office to see Dr. Benjamin Stain for further intervention.  FMLA papers refaxed with originals returned to patient along with a copy of the confirmation.  Return if symptoms worsen or fail to improve.  ___________________________________________ Thayer Ohm, DNP, APRN, FNP-BC Primary Care and Sports Medicine [facility]

## 2023-12-18 ENCOUNTER — Other Ambulatory Visit: Payer: Self-pay

## 2023-12-18 ENCOUNTER — Ambulatory Visit
Admission: EM | Admit: 2023-12-18 | Discharge: 2023-12-18 | Disposition: A | Discharge status: Home / Self Care | Attending: Internal Medicine | Admitting: Internal Medicine

## 2023-12-18 DIAGNOSIS — B029 Zoster without complications: Secondary | ICD-10-CM

## 2023-12-18 MED ORDER — VALACYCLOVIR HCL 1 G PO TABS: 1000.0000 mg | ORAL_TABLET | Freq: Three times a day (TID) | ORAL | 0 refills | Status: AC

## 2023-12-18 MED ORDER — ALBUTEROL SULFATE HFA 108 (90 BASE) MCG/ACT IN AERS: 2.0000 | INHALATION_SPRAY | RESPIRATORY_TRACT | 2 refills | Status: AC | PRN

## 2023-12-18 NOTE — ED Provider Notes (Signed)
 [facility]    CSN: [id number] Arrival date & time: 12/18/23  1305      History   Chief Complaint Chief Complaint  Patient presents with   Rash    HPI John Lester is a 43 y.o. male.   43 year old male who presents urgent care with complaints of a rash that is blistery and tender on his right arm.  He reports that 4 days ago he did notice some soreness in his arm.  The next day he noticed blistering along the inside aspect of his forearm.  He has continued to have new blisters formed but only on the right arm.  He also has some on his right thumb.  He denies fevers, chills, myalgias.   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 Authorizing Provider  valACYclovir  (VALTREX ) 1000 MG tablet Take 1 tablet (1,000 mg total) by mouth 3 (three) times daily for 7 days. 12/18/23 12/25/23 Yes Julies Carmickle A, PA-C  albuterol  (VENTOLIN  HFA) 108 (90 Base) MCG/ACT inhaler Inhale 2 puffs into the lungs every 4 (four) hours as needed for wheezing or shortness of breath (coughing fits). 04/04/22   Luke Orlan HERO, DO  EPINEPHrine  (EPIPEN  2-PAK) 0.3 mg/0.3 mL IJ SOAJ injection Inject 0.3 mg into the muscle as needed for anaphylaxis. 03/10/22   Willo Mini, NP  fluticasone  furoate-vilanterol (BREO ELLIPTA ) 100-25 MCG/ACT AEPB Inhale 1 puff into the lungs daily. Rinse mouth after each use. Use daily during the  spring. 04/04/22   Luke Orlan HERO, DO  levocetirizine (XYZAL) 5 MG tablet Take 5 mg by mouth every evening.    [provider]  meloxicam  (MOBIC ) 15 MG tablet Take 1 tablet (15 mg total) by mouth daily. 03/10/22   Jessup, Joy, NP  Olopatadine-Mometasone (RYALTRIS ) 665-25 MCG/ACT SUSP Place 1-2 sprays into the nose in the morning and at bedtime. 04/04/22   Luke Orlan HERO, DO    Family History Family History  Problem Relation Age of Onset   Allergic rhinitis Mother    Hypertension Mother    Diabetes Mother    Asthma Father    Hyperlipidemia Father    Hypertension Father    Bronchitis Father    Hypertension Maternal Grandmother    Hypertension Maternal Grandfather    Diabetes Maternal Grandfather    Hypertension Paternal Grandmother    Hypertension Paternal Grandfather    Immunodeficiency Neg Hx    Urticaria Neg Hx    Eczema Neg Hx    Angioedema Neg Hx    Atopy Neg Hx     Social History Social History   Tobacco Use   Smoking status: Never    Passive exposure: Never   Smokeless tobacco: Former    Types: Snuff  Vaping Use   Vaping status: Never Used  Substance Use Topics   Alcohol use: Yes  Comment: rarely   Drug use: Never     Allergies   Alpha-gal   Review of Systems Review of Systems  Constitutional:  Negative for chills and fever.  HENT:  Negative for ear pain and sore throat.   Eyes:  Negative for pain and visual disturbance.  Respiratory:  Negative for cough and shortness of breath.   Cardiovascular:  Negative for chest pain and palpitations.  Gastrointestinal:  Negative for abdominal pain and vomiting.  Genitourinary:  Negative for dysuria and hematuria.  Musculoskeletal:  Negative for arthralgias and back pain.  Skin:  Positive for rash. Negative for color change.  Neurological:  Negative for seizures and syncope.  All other systems reviewed and are negative.    Physical Exam Triage Vital Signs ED Triage Vitals  Encounter Vitals Group     BP  12/18/23 1315 (!) 149/98     Girls Systolic BP Percentile --      Girls Diastolic BP Percentile --      Boys Systolic BP Percentile --      Boys Diastolic BP Percentile --      Pulse Rate 12/18/23 1315 94     Resp 12/18/23 1315 16     Temp 12/18/23 1315 99 F (37.2 C)     Temp src --      SpO2 12/18/23 1315 97 %     Weight --      Height --      Head Circumference --      Peak Flow --      Pain Score 12/18/23 1319 2     Pain Loc --      Pain Education --      Exclude from Growth Chart --    No data found.  Updated Vital Signs BP (!) 149/98   Pulse 94   Temp 99 F (37.2 C)   Resp 16   SpO2 97%   Visual Acuity Right Eye Distance:   Left Eye Distance:   Bilateral Distance:    Right Eye Near:   Left Eye Near:    Bilateral Near:     Physical Exam Vitals and nursing note reviewed.  Constitutional:      General: He is not in acute distress.    Appearance: He is well-developed.  HENT:     Head: Normocephalic and atraumatic.  Eyes:     Conjunctiva/sclera: Conjunctivae normal.  Cardiovascular:     Rate and Rhythm: Normal rate and regular rhythm.     Heart sounds: No murmur heard. Pulmonary:     Effort: Pulmonary effort is normal. No respiratory distress.     Breath sounds: Normal breath sounds.  Abdominal:     Palpations: Abdomen is soft.     Tenderness: There is no abdominal tenderness.  Musculoskeletal:        General: No swelling.     Cervical back: Neck supple.  Skin:    General: Skin is warm and dry.     Capillary Refill: Capillary refill takes less than 2 seconds.     Findings: Rash present. Rash is vesicular.         Comments: Clustered vesicular areas on the inner forearm, slightly red base  Neurological:     Mental Status: He is alert.  Psychiatric:        Mood and Affect: Mood normal.      UC Treatments / Results  Labs (all labs ordered are listed, but only abnormal results are displayed) Labs Reviewed -  No data to  display  EKG   Radiology No results found.  Procedures Procedures (including critical care time)  Medications Ordered in UC Medications - No data to display  Initial Impression / Assessment and Plan / UC Course  I have reviewed the triage vital signs and the nursing notes.  Pertinent labs & imaging results that were available during my care of the patient were reviewed by me and considered in my medical decision making (see chart for details).     Herpes zoster without complication   Rash on the right arm consistent with shingles.  We will treat this with the following: Valacyclovir  1000 mg 3 times daily for 7 days. Keep with loose clothing to prevent irritation Make sure to avoid contact with high risk individuals such as the elderly or the very young until the areas are completely scabbed Return to urgent care or PCP if symptoms worsen or fail to resolve.   Final Clinical Impressions(s) / UC Diagnoses   Final diagnoses:  Herpes zoster without complication     Discharge Instructions      Rash on the right arm consistent with shingles.  We will treat this with the following: Valacyclovir  1000 mg 3 times daily for 7 days. Keep with loose clothing to prevent irritation Make sure to avoid contact with high risk individuals such as the elderly or the very young until the areas are completely scabbed Return to urgent care or PCP if symptoms worsen or fail to resolve.      ED Prescriptions     Medication Sig Dispense Auth. Provider   valACYclovir  (VALTREX ) 1000 MG tablet Take 1 tablet (1,000 mg total) by mouth 3 (three) times daily for 7 days. 21 tablet Teresa Almarie LABOR, PA-C      PDMP not reviewed this encounter.   Teresa Almarie LABOR, PA-C 12/18/23 1345

## 2023-12-18 NOTE — Discharge Instructions (Addendum)
 Rash on the right arm consistent with shingles.  We will treat this with the following: Valacyclovir  1000 mg 3 times daily for 7 days. Keep with loose clothing to prevent irritation Make sure to avoid contact with high risk individuals such as the elderly or the very young until the areas are completely scabbed Return to urgent care or PCP if symptoms worsen or fail to resolve.

## 2023-12-18 NOTE — ED Triage Notes (Addendum)
 Blistering rash to right forearm x 5 days. Started with wrist pain before rash appeared. Has discomfort to rash at times when touched. Wants refill for albuterol  inhaler as well.

## 2023-12-28 ENCOUNTER — Ambulatory Visit
Admission: EM | Admit: 2023-12-28 | Discharge: 2023-12-28 | Disposition: A | Discharge status: Home / Self Care | Attending: Family Medicine | Admitting: Family Medicine

## 2023-12-28 DIAGNOSIS — H5712 Ocular pain, left eye: Secondary | ICD-10-CM | POA: Diagnosis not present

## 2023-12-28 DIAGNOSIS — H109 Unspecified conjunctivitis: Secondary | ICD-10-CM

## 2023-12-28 MED ORDER — MOXIFLOXACIN HCL 0.5 % OP SOLN: 1.0000 [drp] | Freq: Three times a day (TID) | OPHTHALMIC | 0 refills | Status: AC

## 2023-12-28 MED ORDER — KETOROLAC TROMETHAMINE 0.5 % OP SOLN: 1.0000 [drp] | Freq: Four times a day (QID) | OPHTHALMIC | 0 refills | Status: AC

## 2023-12-28 NOTE — ED Provider Notes (Signed)
 [facility]    CSN: [id number] Arrival date & time: 12/28/23  1515      History   Chief Complaint Chief Complaint  Patient presents with   Eye Problem    HPI John Lester is a 43 y.o. male.   HPI 43 year old male presents with left eye pain since last night.  PMH significant for obesity.  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 Authorizing Provider  ketorolac  (ACULAR ) 0.5 % ophthalmic solution Place 1 drop into both eyes every 6 (six) hours. 12/28/23  Yes Teddy Sharper, FNP  moxifloxacin  (VIGAMOX ) 0.5 % ophthalmic solution Place 1 drop into the left eye 3 (three) times daily for 5 days. 12/28/23 01/02/24 Yes Teddy Sharper, FNP  albuterol  (VENTOLIN  HFA) 108 (90 Base) MCG/ACT inhaler Inhale 2 puffs into the lungs every 4 (four) hours as needed for wheezing or shortness of breath (coughing fits). 12/18/23   White, Elizabeth A, PA-C  EPINEPHrine  (EPIPEN  2-PAK) 0.3 mg/0.3 mL IJ SOAJ injection Inject 0.3 mg into the muscle as needed for anaphylaxis. 03/10/22   Willo Mini, NP  fluticasone  furoate-vilanterol (BREO ELLIPTA ) 100-25 MCG/ACT AEPB Inhale 1 puff into the lungs daily. Rinse mouth after each use. Use daily during the spring. 04/04/22   Luke Orlan HERO, DO  levocetirizine (XYZAL) 5 MG tablet Take 5 mg by mouth every evening.    [provider]  meloxicam  (MOBIC ) 15 MG tablet Take 1 tablet (15 mg total) by mouth daily. 03/10/22   Jessup, Joy, NP  Olopatadine-Mometasone (RYALTRIS ) 665-25 MCG/ACT SUSP Place 1-2  sprays into the nose in the morning and at bedtime. 04/04/22   Luke Orlan HERO, DO    Family History Family History  Problem Relation Age of Onset   Allergic rhinitis Mother    Hypertension Mother    Diabetes Mother    Asthma Father    Hyperlipidemia Father    Hypertension Father    Bronchitis Father    Hypertension Maternal Grandmother    Hypertension Maternal Grandfather    Diabetes Maternal Grandfather    Hypertension Paternal Grandmother    Hypertension Paternal Grandfather    Immunodeficiency Neg Hx    Urticaria Neg Hx    Eczema Neg Hx    Angioedema Neg Hx    Atopy Neg Hx     Social History Social History   Tobacco Use   Smoking status: Never    Passive exposure: Never   Smokeless tobacco: Former    Types: Snuff  Vaping Use   Vaping status: Never Used  Substance Use Topics   Alcohol use: Yes    Comment: rarely   Drug use: Never     Allergies   Alpha-gal   Review of Systems Review of Systems   Physical Exam Triage Vital Signs ED Triage Vitals  Encounter Vitals Group     BP      Girls Systolic BP Percentile      Girls Diastolic BP Percentile  Boys Systolic BP Percentile      Boys Diastolic BP Percentile      Pulse      Resp      Temp      Temp src      SpO2      Weight      Height      Head Circumference      Peak Flow      Pain Score      Pain Loc      Pain Education      Exclude from Growth Chart    No data found.  Updated Vital Signs BP (!) 136/90   Pulse 93   Temp 98.2 F (36.8 C)   Resp 19   SpO2 98%   Visual Acuity Right Eye Distance:   Left Eye Distance:   Bilateral Distance:    Right Eye Near:   Left Eye Near:    Bilateral Near:     Physical Exam Vitals and nursing note reviewed.  Constitutional:      Appearance: Normal appearance. He is normal weight.  HENT:     Head: Normocephalic and atraumatic.     Mouth/Throat:     Mouth: Mucous membranes are moist.     Pharynx: Oropharynx is clear.  Eyes:      Extraocular Movements: Extraocular movements intact.     Pupils: Pupils are equal, round, and reactive to light.     Comments: Left eye: Inspected with loupes no foreign body noted, instilled 3 drops of tetracaine hydrochloride, inspected with loupes and upper eyelid inverted no foreign body noted; irrigated left eye with eye solution.  Patient tolerated procedure well.  Cardiovascular:     Rate and Rhythm: Normal rate and regular rhythm.     Pulses: Normal pulses.     Heart sounds: Normal heart sounds.  Pulmonary:     Effort: Pulmonary effort is normal.     Breath sounds: Normal breath sounds. No wheezing, rhonchi or rales.  Musculoskeletal:        General: Normal range of motion.  Skin:    General: Skin is warm and dry.  Neurological:     General: No focal deficit present.     Mental Status: He is alert and oriented to person, place, and time. Mental status is at baseline.  Psychiatric:        Mood and Affect: Mood normal.        Behavior: Behavior normal.      UC Treatments / Results  Labs (all labs ordered are listed, but only abnormal results are displayed) Labs Reviewed - No data to display  EKG   Radiology No results found.  Procedures Procedures (including critical care time)  Medications Ordered in UC Medications - No data to display  Initial Impression / Assessment and Plan / UC Course  I have reviewed the triage vital signs and the nursing notes.  Pertinent labs & imaging results that were available during my care of the patient were reviewed by me and considered in my medical decision making (see chart for details).     MDM: 1.  Left eye pain-Rx'd Acular  0.5% ophthalmic solution: Place 1 drop into left eye every 6 hours for pain; 2.  Conjunctivitis of left eye, unspecified conjunctivitis type-Rx'd Vigamox  0.5% ophthalmic solution: Place 1 drop into left eye 3 times daily for the next 5 days. Advised patient to instill eyedrops as directed.  Advised please  wait 10 minutes after instilling Vigamox   prior to using Acular .  Advised patient to wear glasses for the next 3 to 5 days until eyes return to baseline.  Advised if symptoms worsen and/or unresolved please follow-up with your ophthalmologist/optometrist. Final Clinical Impressions(s) / UC Diagnoses   Final diagnoses:  Left eye pain  Conjunctivitis of left eye, unspecified conjunctivitis type     Discharge Instructions      Advised patient to instill eyedrops as directed.  Advised please wait 10 minutes after instilling Vigamox  prior to using Acular .  Advised patient to wear glasses for the next 3 to 5 days until eyes return to baseline.  Advised if symptoms worsen and/or unresolved please follow-up with your ophthalmologist/optometrist     ED Prescriptions     Medication Sig Dispense Auth. Provider   moxifloxacin  (VIGAMOX ) 0.5 % ophthalmic solution Place 1 drop into the left eye 3 (three) times daily for 5 days. 3 mL Cervando Durnin, FNP   ketorolac  (ACULAR ) 0.5 % ophthalmic solution Place 1 drop into both eyes every 6 (six) hours. 5 mL Tinika Bucknam, FNP      PDMP not reviewed this encounter.   Teddy Sharper, FNP 12/28/23 [phone]

## 2023-12-28 NOTE — ED Triage Notes (Addendum)
 Pt presents to uc with co left eye pain since last night. Pt reports he wears contacts and noticed pain and discomfort since taking them out last night. Pt did use some antibiotic eyedrops and a 20 mg prednisone  po. With some improvement. He is concerned he may have something going on with that sinus on the left side.

## 2023-12-28 NOTE — Discharge Instructions (Addendum)
 Advised patient to instill eyedrops as directed.  Advised please wait 10 minutes after instilling Vigamox  prior to using Acular .  Advised patient to wear glasses for the next 3 to 5 days until eyes return to baseline.  Advised if symptoms worsen and/or unresolved please follow-up with your ophthalmologist/optometrist
